# Patient Record
Sex: Female | Born: 1980 | Hispanic: No | Marital: Married | Smoking: Never smoker
Health system: Southern US, Community
[De-identification: ages and names within clinical notes are randomized; demographics above are authoritative.]

## PROBLEM LIST (undated history)

## (undated) DIAGNOSIS — G35 Multiple sclerosis: Secondary | ICD-10-CM

## (undated) HISTORY — PX: NO PAST SURGERIES: SHX2092

---

## 2007-11-24 ENCOUNTER — Inpatient Hospital Stay (HOSPITAL_COMMUNITY): Admission: AD | Admit: 2007-11-24 | Discharge: 2007-11-24 | Payer: Self-pay | Admitting: Obstetrics and Gynecology

## 2008-07-27 ENCOUNTER — Encounter: Admission: RE | Admit: 2008-07-27 | Discharge: 2008-07-27 | Payer: Self-pay | Admitting: Family Medicine

## 2009-07-14 ENCOUNTER — Inpatient Hospital Stay (HOSPITAL_COMMUNITY): Admission: AD | Admit: 2009-07-14 | Discharge: 2009-07-16 | Payer: Self-pay | Admitting: Obstetrics and Gynecology

## 2009-07-15 ENCOUNTER — Encounter (INDEPENDENT_AMBULATORY_CARE_PROVIDER_SITE_OTHER): Payer: Self-pay | Admitting: Obstetrics and Gynecology

## 2010-07-07 NOTE — L&D Delivery Note (Signed)
Delivery Note At 3:02 PM a viable female was delivered via Vaginal, Spontaneous Delivery (Presentation:vtx ).  APGAR: 9, 9; weight .  pending Placenta status: Intact, Spontaneous.  Cord: 3 vessels with the following complications: None.   Anesthesia: Epidural  Episiotomy: none Lacerations: none Suture Repair: none Est. Blood Loss (mL): 400  Mom to postpartum.  Baby to nursery-stable.  Shantele Reller S 02/07/2011, 3:14 PM

## 2010-07-16 LAB — HIV ANTIBODY (ROUTINE TESTING W REFLEX): HIV: NONREACTIVE

## 2010-07-16 LAB — ABO/RH: RH Type: POSITIVE

## 2010-09-22 LAB — RPR: RPR Ser Ql: NONREACTIVE

## 2010-09-22 LAB — CBC
Hemoglobin: 10.3 g/dL — ABNORMAL LOW (ref 12.0–15.0)
MCHC: 33.7 g/dL (ref 30.0–36.0)
MCV: 95.3 fL (ref 78.0–100.0)
Platelets: 174 10*3/uL (ref 150–400)
Platelets: 193 10*3/uL (ref 150–400)
RBC: 3.67 MIL/uL — ABNORMAL LOW (ref 3.87–5.11)
RDW: 14.6 % (ref 11.5–15.5)
WBC: 6.3 10*3/uL (ref 4.0–10.5)
WBC: 9.7 10*3/uL (ref 4.0–10.5)

## 2011-01-02 ENCOUNTER — Inpatient Hospital Stay (HOSPITAL_COMMUNITY)
Admission: AD | Admit: 2011-01-02 | Discharge: 2011-01-02 | Disposition: A | Discharge status: Home / Self Care | Payer: 59 | Source: Ambulatory Visit | Attending: Obstetrics and Gynecology | Admitting: Obstetrics and Gynecology

## 2011-01-02 DIAGNOSIS — O4700 False labor before 37 completed weeks of gestation, unspecified trimester: Secondary | ICD-10-CM

## 2011-02-05 ENCOUNTER — Encounter (HOSPITAL_COMMUNITY): Payer: Self-pay | Admitting: *Deleted

## 2011-02-05 ENCOUNTER — Inpatient Hospital Stay (HOSPITAL_COMMUNITY)
Admission: AD | Admit: 2011-02-05 | Discharge: 2011-02-06 | Disposition: A | Discharge status: Home / Self Care | Payer: 59 | Source: Ambulatory Visit | Attending: Obstetrics and Gynecology | Admitting: Obstetrics and Gynecology

## 2011-02-05 DIAGNOSIS — O479 False labor, unspecified: Secondary | ICD-10-CM | POA: Insufficient documentation

## 2011-02-05 HISTORY — DX: Multiple sclerosis: G35

## 2011-02-05 NOTE — Progress Notes (Cosign Needed)
Pt G4 P2 at 39wks, having contractions and pressure.  SVE 4cm on 7/31.

## 2011-02-06 ENCOUNTER — Encounter (HOSPITAL_COMMUNITY): Payer: Self-pay | Admitting: *Deleted

## 2011-02-06 ENCOUNTER — Other Ambulatory Visit (HOSPITAL_COMMUNITY): Payer: Self-pay | Admitting: Obstetrics and Gynecology

## 2011-02-07 ENCOUNTER — Encounter (HOSPITAL_COMMUNITY): Payer: Self-pay

## 2011-02-07 ENCOUNTER — Inpatient Hospital Stay (HOSPITAL_COMMUNITY): Payer: 59 | Admitting: Anesthesiology

## 2011-02-07 ENCOUNTER — Encounter (HOSPITAL_COMMUNITY): Payer: Self-pay | Admitting: Anesthesiology

## 2011-02-07 ENCOUNTER — Inpatient Hospital Stay (HOSPITAL_COMMUNITY)
Admission: RE | Admit: 2011-02-07 | Discharge: 2011-02-08 | DRG: 775 | Disposition: A | Discharge status: Home / Self Care | Payer: 59 | Source: Ambulatory Visit | Attending: Obstetrics and Gynecology | Admitting: Obstetrics and Gynecology

## 2011-02-07 DIAGNOSIS — Z22330 Carrier of Group B streptococcus: Secondary | ICD-10-CM

## 2011-02-07 DIAGNOSIS — G35 Multiple sclerosis: Secondary | ICD-10-CM | POA: Diagnosis present

## 2011-02-07 DIAGNOSIS — O99892 Other specified diseases and conditions complicating childbirth: Principal | ICD-10-CM | POA: Diagnosis present

## 2011-02-07 LAB — CBC
Hemoglobin: 12.2 g/dL (ref 12.0–15.0)
MCHC: 34.6 g/dL (ref 30.0–36.0)
RDW: 14 % (ref 11.5–15.5)

## 2011-02-07 LAB — RPR: RPR Ser Ql: NONREACTIVE

## 2011-02-07 MED ORDER — PENICILLIN G POTASSIUM 5000000 UNITS IJ SOLR
2.5000 10*6.[IU] | INTRAVENOUS | Status: DC
  Administered 2011-02-07: 2.5 10*6.[IU] via INTRAVENOUS
  Filled 2011-02-07 (×5): qty 2.5

## 2011-02-07 MED ORDER — BISACODYL 10 MG RE SUPP: 10.0000 mg | Freq: Every day | RECTAL | Status: DC | PRN

## 2011-02-07 MED ORDER — PHENYLEPHRINE 40 MCG/ML (10ML) SYRINGE FOR IV PUSH (FOR BLOOD PRESSURE SUPPORT)
80.0000 ug | PREFILLED_SYRINGE | INTRAVENOUS | Status: DC | PRN
  Filled 2011-02-07 (×2): qty 5

## 2011-02-07 MED ORDER — SIMETHICONE 80 MG PO CHEW: 80.0000 mg | CHEWABLE_TABLET | ORAL | Status: DC | PRN

## 2011-02-07 MED ORDER — DIPHENHYDRAMINE HCL 25 MG PO CAPS: 25.0000 mg | ORAL_CAPSULE | Freq: Four times a day (QID) | ORAL | Status: DC | PRN

## 2011-02-07 MED ORDER — LACTATED RINGERS IV SOLN: 500.0000 mL | INTRAVENOUS | Status: DC | PRN

## 2011-02-07 MED ORDER — ACETAMINOPHEN 325 MG PO TABS: 650.0000 mg | ORAL_TABLET | ORAL | Status: DC | PRN

## 2011-02-07 MED ORDER — OXYCODONE-ACETAMINOPHEN 5-325 MG PO TABS: 2.0000 | ORAL_TABLET | ORAL | Status: DC | PRN

## 2011-02-07 MED ORDER — WITCH HAZEL-GLYCERIN EX PADS: 1.0000 "application " | MEDICATED_PAD | CUTANEOUS | Status: DC | PRN

## 2011-02-07 MED ORDER — TETANUS-DIPHTH-ACELL PERTUSSIS 5-2.5-18.5 LF-MCG/0.5 IM SUSP: 0.5000 mL | Freq: Once | INTRAMUSCULAR | Status: DC

## 2011-02-07 MED ORDER — OXYCODONE-ACETAMINOPHEN 5-325 MG PO TABS: 1.0000 | ORAL_TABLET | ORAL | Status: DC | PRN

## 2011-02-07 MED ORDER — OXYTOCIN 20 UNITS IN LACTATED RINGERS INFUSION - SIMPLE
125.0000 mL/h | Freq: Once | INTRAVENOUS | Status: AC
  Administered 2011-02-07: 125 mL/h via INTRAVENOUS

## 2011-02-07 MED ORDER — EPHEDRINE 5 MG/ML INJ
10.0000 mg | INTRAVENOUS | Status: DC | PRN
  Filled 2011-02-07: qty 4

## 2011-02-07 MED ORDER — LIDOCAINE HCL (PF) 1 % IJ SOLN
30.0000 mL | INTRAMUSCULAR | Status: DC | PRN
  Filled 2011-02-07: qty 30

## 2011-02-07 MED ORDER — FLEET ENEMA 7-19 GM/118ML RE ENEM: 1.0000 | ENEMA | RECTAL | Status: DC | PRN

## 2011-02-07 MED ORDER — ONDANSETRON HCL 4 MG PO TABS: 4.0000 mg | ORAL_TABLET | ORAL | Status: DC | PRN

## 2011-02-07 MED ORDER — ONDANSETRON HCL 4 MG/2ML IJ SOLN: 4.0000 mg | Freq: Four times a day (QID) | INTRAMUSCULAR | Status: DC | PRN

## 2011-02-07 MED ORDER — SENNOSIDES-DOCUSATE SODIUM 8.6-50 MG PO TABS
2.0000 | ORAL_TABLET | Freq: Every day | ORAL | Status: DC
  Administered 2011-02-07: 2 via ORAL

## 2011-02-07 MED ORDER — DIBUCAINE 1 % RE OINT: 1.0000 "application " | TOPICAL_OINTMENT | RECTAL | Status: DC | PRN

## 2011-02-07 MED ORDER — EPHEDRINE 5 MG/ML INJ
10.0000 mg | INTRAVENOUS | Status: DC | PRN
  Filled 2011-02-07 (×2): qty 4

## 2011-02-07 MED ORDER — SODIUM CHLORIDE 0.9 % IJ SOLN: 3.0000 mL | INTRAMUSCULAR | Status: DC | PRN

## 2011-02-07 MED ORDER — TERBUTALINE SULFATE 1 MG/ML IJ SOLN: 0.2500 mg | Freq: Once | INTRAMUSCULAR | Status: DC | PRN

## 2011-02-07 MED ORDER — LACTATED RINGERS IV SOLN
INTRAVENOUS | Status: DC
  Administered 2011-02-07 (×2): via INTRAVENOUS

## 2011-02-07 MED ORDER — BENZOCAINE-MENTHOL 20-0.5 % EX AERO: 1.0000 "application " | INHALATION_SPRAY | CUTANEOUS | Status: DC | PRN

## 2011-02-07 MED ORDER — SODIUM CHLORIDE 0.9 % IJ SOLN: 3.0000 mL | Freq: Two times a day (BID) | INTRAMUSCULAR | Status: DC

## 2011-02-07 MED ORDER — DIPHENHYDRAMINE HCL 50 MG/ML IJ SOLN: 12.5000 mg | INTRAMUSCULAR | Status: DC | PRN

## 2011-02-07 MED ORDER — ONDANSETRON HCL 4 MG/2ML IJ SOLN: 4.0000 mg | INTRAMUSCULAR | Status: DC | PRN

## 2011-02-07 MED ORDER — OXYTOCIN 20 UNITS IN LACTATED RINGERS INFUSION - SIMPLE: 125.0000 mL/h | INTRAVENOUS | Status: DC | PRN

## 2011-02-07 MED ORDER — IBUPROFEN 600 MG PO TABS
600.0000 mg | ORAL_TABLET | Freq: Four times a day (QID) | ORAL | Status: DC | PRN
  Filled 2011-02-07: qty 1

## 2011-02-07 MED ORDER — FENTANYL 2.5 MCG/ML BUPIVACAINE 1/10 % EPIDURAL INFUSION (WH - ANES)
14.0000 mL/h | INTRAMUSCULAR | Status: DC
  Administered 2011-02-07 (×2): 14 mL/h via EPIDURAL
  Filled 2011-02-07 (×2): qty 60

## 2011-02-07 MED ORDER — SODIUM CHLORIDE 0.9 % IV SOLN: 250.0000 mL | INTRAVENOUS | Status: DC

## 2011-02-07 MED ORDER — CITRIC ACID-SODIUM CITRATE 334-500 MG/5ML PO SOLN: 30.0000 mL | ORAL | Status: DC | PRN

## 2011-02-07 MED ORDER — LACTATED RINGERS IV SOLN
500.0000 mL | Freq: Once | INTRAVENOUS | Status: AC
  Administered 2011-02-07: 1000 mL via INTRAVENOUS

## 2011-02-07 MED ORDER — PHENYLEPHRINE 40 MCG/ML (10ML) SYRINGE FOR IV PUSH (FOR BLOOD PRESSURE SUPPORT)
80.0000 ug | PREFILLED_SYRINGE | INTRAVENOUS | Status: DC | PRN
  Filled 2011-02-07: qty 5

## 2011-02-07 MED ORDER — LIDOCAINE HCL 1.5 % IJ SOLN
INTRAMUSCULAR | Status: DC | PRN
  Administered 2011-02-07 (×2): 4 mL

## 2011-02-07 MED ORDER — IBUPROFEN 600 MG PO TABS
600.0000 mg | ORAL_TABLET | Freq: Four times a day (QID) | ORAL | Status: DC
  Administered 2011-02-07 – 2011-02-08 (×4): 600 mg via ORAL
  Filled 2011-02-07 (×3): qty 1

## 2011-02-07 MED ORDER — PRENATAL PLUS 27-1 MG PO TABS
1.0000 | ORAL_TABLET | Freq: Every day | ORAL | Status: DC
  Administered 2011-02-08: 1 via ORAL
  Filled 2011-02-07: qty 1

## 2011-02-07 MED ORDER — PENICILLIN G POTASSIUM 5000000 UNITS IJ SOLR
5.0000 10*6.[IU] | Freq: Once | INTRAVENOUS | Status: AC
  Administered 2011-02-07: 5 10*6.[IU] via INTRAVENOUS
  Filled 2011-02-07: qty 5

## 2011-02-07 MED ORDER — LANOLIN HYDROUS EX OINT: TOPICAL_OINTMENT | CUTANEOUS | Status: DC | PRN

## 2011-02-07 MED ORDER — ZOLPIDEM TARTRATE 5 MG PO TABS: 5.0000 mg | ORAL_TABLET | Freq: Every evening | ORAL | Status: DC | PRN

## 2011-02-07 MED ORDER — OXYTOCIN 20 UNITS IN LACTATED RINGERS INFUSION - SIMPLE
1.0000 m[IU]/min | INTRAVENOUS | Status: DC
  Administered 2011-02-07: 2 m[IU]/min via INTRAVENOUS
  Filled 2011-02-07: qty 1000

## 2011-02-07 NOTE — Progress Notes (Signed)
Alexa Goodwin is a 30 y.o. [id number] at [redacted]w[redacted]d by ultrasound admitted for induction of labor due to Elective at term.  Subjective:   Objective: BP 113/69  Pulse 64  Temp(Src) 98.1 F (36.7 C) (Oral)  Resp 18  Ht 5\' 6"  (1.676 m)  Wt 159 lb (72.122 kg)  BMI 25.66 kg/m2  SpO2 99%      FHT:  FHR: 15 bpm, variability: moderate,  accelerations:  Present,  decelerations:  Absent UC:   regular, every 3 minutes SVE:   Dilation: 6.5 Effacement (%): 100 Station: -1 Exam by:: Aaron Boeh  Labs: Lab Results  Component Value Date   WBC 5.5 02/07/2011   HGB 12.2 02/07/2011   HCT 35.3* 02/07/2011   MCV 94.1 02/07/2011   PLT 175 02/07/2011    Assessment / Plan: Induction of labor due to term favorable cx,  progressing well on pitocin  Labor: Progressing normally Preeclampsia:  no signs or symptoms of toxicity Fetal Wellbeing:  Category I Pain Control:  Epidural I/D:  n/a Anticipated MOD:  NSVD  Shady Bradish S 02/07/2011, 1:43 PM

## 2011-02-07 NOTE — Anesthesia Procedure Notes (Signed)
Epidural Patient location during procedure: OB Start time: 02/07/2011 9:35 AM  Staffing Anesthesiologist: Tylan Kinn A. Performed by: anesthesiologist   Preanesthetic Checklist Completed: patient identified, site marked, surgical consent, pre-op evaluation, timeout performed, IV checked, risks and benefits discussed and monitors and equipment checked  Epidural Patient position: sitting Prep: site prepped and draped and DuraPrep Patient monitoring: continuous pulse ox and blood pressure Approach: midline Injection technique: LOR air  Needle:  Needle type: Tuohy  Needle gauge: 17 G Needle length: 9 cm Needle insertion depth: 5 cm cm Catheter type: closed end flexible Catheter size: 19 Gauge Catheter at skin depth: 10 cm Test dose: negative and 1.5% lidocaine  Assessment Sensory level: T8 Events: blood not aspirated, injection not painful, no injection resistance, negative IV test and no paresthesia  Additional Notes Patient is more comfortable after epidural dosed. Please see RN's note for documentation of vital signs and FHR which are stable.

## 2011-02-07 NOTE — H&P (Signed)
Alayza Donze is a 30 y.o. female presenting for induction.  39.3 weeks with favorable cervix.  PNC COMPLICATED BY MULTIPLE SCLEROSIS.  POST GBS. History OB History    Grav Para Term Preterm Abortions TAB SAB Ect Mult Living   4 2 2  0 1 0 1 0 0 2     Past Medical History  Diagnosis Date  . Multiple sclerosis    Past Surgical History  Procedure Date  . No past surgeries    Family History: family history is not on file. Social History:  reports that she has never smoked. She has never used smokeless tobacco. She reports that she does not drink alcohol or use illicit drugs.  ROS    Height 5\' 6"  (1.676 m), weight 159 lb (72.122 kg). Exam Physical Exam GRAVID UTERUS C/W DATES.  CERVIX 4 CM AND 50%.  VTX. Prenatal labs: ABO, Rh:   Antibody: Negative (01/10 0000) Rubella:   RPR: Nonreactive (01/10 0000)  HBsAg: Negative (01/10 0000)  HIV: Non-reactive (01/10 0000)  GBS: Positive (07/10 0000)   Assessment/Plan: PLAN AROM.  RISK OF PITOCIN DISCUSSED.  PENICILLIN FOR GBS.   Syniyah Bourne S 02/07/2011, 8:23 AM

## 2011-02-07 NOTE — Anesthesia Preprocedure Evaluation (Signed)
Anesthesia Evaluation  Name, MR# and DOB Patient awake  General Assessment Comment  Reviewed: Allergy & Precautions, H&P  and Patient's Chart, lab work & pertinent test results  Airway Mallampati: II TM Distance: >3 FB Neck ROM: full    Dental No notable dental hx (+) Teeth Intact   Pulmonaryneg pulmonary ROS    clear to auscultation  pulmonary exam normal   Cardiovascular regular Normal   Neuro/PsychHx/o Multiple Sclerosis. Quiescent for last year. Sx numbness lower extremities. On no Rx. Asymptomatic at present time   GI/Hepatic/Renal negative GI ROS, negative Liver ROS, and negative Renal ROS (+)       Endo/Other  Negative Endocrine ROS (+)   Abdominal   Musculoskeletal  Hematology negative hematology ROS (+)   Peds  Reproductive/Obstetrics (+) Pregnancy   Anesthesia Other Findings             Anesthesia Physical Anesthesia Plan  ASA: II  Anesthesia Plan: Epidural   Post-op Pain Management:    Induction:   Airway Management Planned:   Additional Equipment:   Intra-op Plan:   Post-operative Plan:   Informed Consent: I have reviewed the patients History and Physical, chart, labs and discussed the procedure including the risks, benefits and alternatives for the proposed anesthesia with the patient or authorized representative who has indicated his/her understanding and acceptance.     Plan Discussed with: Anesthesiologist  Anesthesia Plan Comments:         Anesthesia Quick Evaluation

## 2011-02-08 ENCOUNTER — Encounter (HOSPITAL_COMMUNITY): Payer: Self-pay

## 2011-02-08 LAB — CBC
Platelets: 147 10*3/uL — ABNORMAL LOW (ref 150–400)
RDW: 14 % (ref 11.5–15.5)
WBC: 7.8 10*3/uL (ref 4.0–10.5)

## 2011-02-08 NOTE — Progress Notes (Signed)
Post [place] Day one Subjective: no complaints  Objective: Blood pressure 105/73, pulse 73, temperature 97.9 F (36.6 C), temperature source Other (Comment), resp. rate 18, height 5\' 6"  (1.676 m), weight 159 lb (72.122 kg), SpO2 99.00%, unknown if currently breastfeeding.  Physical Exam:  General: alert Lochia: appropriate Uterine Fundus: firm DVT Evaluation: No evidence of DVT seen on physical exam.   Basename 02/08/11 0523 02/07/11 0830  HGB 10.4* 12.2  HCT 30.8* 35.3*    Assessment/Plan: Discharge home   LOS: 1 day   Janes Colegrove S 02/08/2011, 8:55 AM

## 2011-02-08 NOTE — Anesthesia Postprocedure Evaluation (Signed)
  Anesthesia Post-op Note  Patient: Archivist  Procedure(s) Performed: * Lumbar Epidural for L & D *  Patient Location: Labor and Delivery  Anesthesia Type: Epidural  Level of Consciousness: awake, alert  and oriented  Airway and Oxygen Therapy: Patient Spontanous Breathing  Post-op Pain: none  Post-op Assessment: Post-op Vital signs reviewed, Patient's Cardiovascular Status Stable, Respiratory Function Stable, Patent Airway, No signs of Nausea or vomiting, Pain level controlled, No headache, No backache, No residual numbness and No residual motor weakness  Post-op Vital Signs: Reviewed and stable  Complications: No apparent anesthesia complications

## 2011-02-08 NOTE — Progress Notes (Signed)
Baby going to nursery for circ. Mom reports that baby has been nursing very often throughout the night.  Reassurance given. Mom has history of low milk supply and only nrused 1 month with last baby. (Nursed the first baby for 5 months but needed supplementation) With both babies, began supplementation after a few Merrisa Skorupski because she "didn't have enough milk" Encouraged to page for assist when baby wakes after circ. No questions at present.

## 2011-02-08 NOTE — Progress Notes (Signed)
Baby in nursery for hearing screen now. Mom states baby nursed well for 10 minutes just before going to nursery. Manual pump given with instructions. No questions at present. For discharge later today. Encouraged to call for outpatient appt if needed.

## 2011-02-08 NOTE — Discharge Summary (Signed)
Obstetric Discharge Summary Reason for Admission: onset of labor and induction of labor Prenatal Procedures: none Intrapartum Procedures: spontaneous vaginal delivery Postpartum Procedures: none Complications-Operative and Postpartum: none Hemoglobin  Date Value Range Status  02/08/2011 10.4* 12.0-15.0 (g/dL) Final     HCT  Date Value Range Status  02/08/2011 30.8* 36.0-46.0 (%) Final    Discharge Diagnoses: Term Pregnancy-delivered  Discharge Information: Date: 02/08/2011 Activity: pelvic rest Diet: routine Medications: PNV Condition: stable Instructions: refer to practice specific booklet Discharge to: home Follow-up Information    Call in 6 weeks to follow up.         Newborn Data: Live born female  Birth Weight: 6 lb 5.9 oz (2890 g) APGAR: 9, 9  Home with mother.  Meggin Ola S 02/08/2011, 9:04 AM

## 2011-02-08 NOTE — Anesthesia Postprocedure Evaluation (Signed)
  Anesthesia Post-op Note  Patient: Alexa Goodwin  Procedure(s) Performed: * No surgery found *  Patient Location: PACU and Mother/Baby  Anesthesia Type: Epidural  Level of Consciousness: awake, alert , oriented and patient cooperative  Airway and Oxygen Therapy: Patient Spontanous Breathing  Post-op Pain: none  Post-op Assessment: Post-op Vital signs reviewed, Respiratory Function Stable and No signs of Nausea or vomiting  Post-op Vital Signs: Reviewed and stable  Complications: No apparent anesthesia complications

## 2011-04-02 LAB — URINALYSIS, ROUTINE W REFLEX MICROSCOPIC
Nitrite: NEGATIVE
Protein, ur: NEGATIVE
Specific Gravity, Urine: 1.02
Urobilinogen, UA: 0.2

## 2011-04-02 LAB — URINE MICROSCOPIC-ADD ON

## 2011-11-06 ENCOUNTER — Telehealth: Payer: Self-pay | Admitting: *Deleted

## 2011-11-12 ENCOUNTER — Telehealth: Payer: Self-pay | Admitting: Internal Medicine

## 2011-11-12 NOTE — Telephone Encounter (Signed)
Referred by Dr. Zelphia Cairo Dx- Neutropenia

## 2011-11-12 NOTE — Telephone Encounter (Signed)
pt aware of her new pt appt 5/14   aom

## 2011-11-18 ENCOUNTER — Ambulatory Visit (HOSPITAL_BASED_OUTPATIENT_CLINIC_OR_DEPARTMENT_OTHER): Payer: 59 | Admitting: Internal Medicine

## 2011-11-18 ENCOUNTER — Ambulatory Visit: Payer: 59

## 2011-11-18 ENCOUNTER — Other Ambulatory Visit (HOSPITAL_BASED_OUTPATIENT_CLINIC_OR_DEPARTMENT_OTHER): Payer: 59 | Admitting: Lab

## 2011-11-18 DIAGNOSIS — D649 Anemia, unspecified: Secondary | ICD-10-CM

## 2011-11-18 DIAGNOSIS — D61818 Other pancytopenia: Secondary | ICD-10-CM

## 2011-11-18 DIAGNOSIS — D539 Nutritional anemia, unspecified: Secondary | ICD-10-CM

## 2011-11-18 DIAGNOSIS — D709 Neutropenia, unspecified: Secondary | ICD-10-CM

## 2011-11-18 LAB — CBC & DIFF AND RETIC
BASO%: 0.5 % (ref 0.0–2.0)
Basophils Absolute: 0 10*3/uL (ref 0.0–0.1)
EOS%: 6.7 % (ref 0.0–7.0)
Eosinophils Absolute: 0.3 10*3/uL (ref 0.0–0.5)
HCT: 38.6 % (ref 34.8–46.6)
HGB: 13.3 g/dL (ref 11.6–15.9)
Immature Retic Fract: 3.1 % (ref 1.60–10.00)
LYMPH%: 45.2 % (ref 14.0–49.7)
MCH: 30.6 pg (ref 25.1–34.0)
MCHC: 34.5 g/dL (ref 31.5–36.0)
MCV: 88.7 fL (ref 79.5–101.0)
MONO#: 0.4 10*3/uL (ref 0.1–0.9)
MONO%: 10.4 % (ref 0.0–14.0)
NEUT#: 1.5 10*3/uL (ref 1.5–6.5)
NEUT%: 37.2 % — ABNORMAL LOW (ref 38.4–76.8)
Platelets: 291 10*3/uL (ref 145–400)
RBC: 4.35 10*6/uL (ref 3.70–5.45)
RDW: 13.3 % (ref 11.2–14.5)
Retic %: 1.23 % (ref 0.70–2.10)
Retic Ct Abs: 53.51 10*3/uL (ref 33.70–90.70)
WBC: 4.1 10*3/uL (ref 3.9–10.3)
lymph#: 1.8 10*3/uL (ref 0.9–3.3)

## 2011-11-18 LAB — COMPREHENSIVE METABOLIC PANEL
Albumin: 4.4 g/dL (ref 3.5–5.2)
Alkaline Phosphatase: 58 U/L (ref 39–117)
BUN: 14 mg/dL (ref 6–23)
CO2: 25 mEq/L (ref 19–32)
Glucose, Bld: 86 mg/dL (ref 70–99)
Potassium: 4.2 mEq/L (ref 3.5–5.3)
Total Bilirubin: 0.4 mg/dL (ref 0.3–1.2)

## 2011-11-18 LAB — FERRITIN: Ferritin: 22 ng/mL (ref 10–291)

## 2011-11-18 LAB — IRON AND TIBC
Iron: 58 ug/dL (ref 42–145)
UIBC: 262 ug/dL (ref 125–400)

## 2011-11-18 LAB — LACTATE DEHYDROGENASE: LDH: 127 U/L (ref 94–250)

## 2011-11-18 LAB — FOLATE: Folate: >20 ng/mL

## 2011-11-18 NOTE — Progress Notes (Signed)
[facility] [phone]   [phone]  CONSULT NOTE  REASON FOR CONSULTATION:  31 years old white female with you pancytopenia  HPI Alexa Goodwin is a 31 y.o. female with no significant past medical history except for multiple sclerosis and history of anemia during pregnancy. The patient mentions that for the last 3 weeks she has been complaining of some dizzy spells. She was seen by her gynecologist Dr.Adkins and CBC was performed on 11/05/2011 and it showed low white blood count of 3.2 with absolute neutrophil count of 1400. The patient had normal hemoglobin of 13.1 hematocrit 39.4% and normal platelets count of 250,000. Previous CBCs over the last few years showed variation in her white blood count between normal to slightly low. On January 8 of 2007 her white blood count was 6.3, on 07/17/2010 WBC was 3.3 on August 3 of 2012 her white blood count was 5.5. The patient is feeling fine today with no specific complaints. She had some vaginal blood loss after IUD placement. She denied having any significant weight loss or night sweats. She has no bleeding or bruises, no palpable lymphadenopathy. She does not take any over-the-counter medication except for occasional Advil every few weeks. She denied having any recent viral infection. The patient is not on any chronic medication except for multivitamins.  She is married and has 3 children age 38 years, 2 years and 9 months. 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   @SFHPI @  Past Medical History  Diagnosis Date  . Multiple sclerosis     Past Surgical History  Procedure Date  . No past surgeries     No family history on file.  Social History History  Substance Use Topics  . Smoking status: Never Smoker   . Smokeless tobacco: Never Used  . Alcohol Use: No    No Known Allergies  Current Outpatient Prescriptions  Medication Sig Dispense Refill  . calcium carbonate  (OS-CAL) 600 MG TABS Take 600 mg by mouth daily.      . fish oil-omega-3 fatty acids 1000 MG capsule Take 2 g by mouth daily.      . Multiple Vitamin (MULTIVITAMIN) tablet Take 1 tablet by mouth daily.      . vitamin B-12 (CYANOCOBALAMIN) 50 MCG tablet Take 50 mcg by mouth daily.      . prenatal vitamin w/FE, FA (PRENATAL 1 + 1) 27-1 MG TABS Take 1 tablet by mouth daily.          Review of Systems  A comprehensive review of systems was negative.  Physical Exam  AVW:UJWJX, healthy, no distress, well nourished and well developed SKIN: skin color, texture, turgor are normal HEAD: Normocephalic, No masses, lesions, tenderness or abnormalities EYES: normal EARS: External ears normal OROPHARYNX:no exudate and no erythema  NECK: supple, no adenopathy LYMPH:  no palpable lymphadenopathy, no hepatosplenomegaly BREAST:not examined LUNGS: clear to auscultation  HEART: regular rate & rhythm, no murmurs and no gallops ABDOMEN:abdomen soft, non-tender, normal bowel sounds and no masses or organomegaly BACK: Back symmetric, no curvature. EXTREMITIES:no joint deformities, effusion, or inflammation, no edema, no skin discoloration, no clubbing, no cyanosis  NEURO: alert & oriented x 3 with fluent speech, no focal motor/sensory deficits, gait normal  PERFORMANCE STATUS: ECOG 0  Studies/Results: CBC today showed white blood count was normal at 4.1, absolute neutrophil count 1500, hemoglobin 13.3, hematocrit 38.6% and platelets count 291,000. Comprehensive metabolic panel was unremarkable for any abnormality.  LDH was normal at 127. Iron 58, TIBC 320, iron saturation 18%.   ASSESSMENT: This is a very pleasant 31 years old white female who was seen for evaluation of Leukocytopenia. The patient is feeling better today and her daughter white blood count is normal, with no significant abnormalities in her CBC. Her previous leukocytopenia could be transient in nature that resolved spontaneously.  PLAN: I  have a lengthy discussion with the patient and her husband today about her condition.  I did not recommend any further investigation at this point.  I will continue the patient observation with routine followup visit with her primary care physician or gynecologist.  I don't see a need for the patient will continue routine followup visit with me at the cancer Center at this point but I would be happy to see her in the future if needed. I gave the patient and her husband the time to ask questions and I answered them completely to their satisfactions.  All questions were answered. The patient knows to call the clinic with any problems, questions or concerns. We can certainly see the patient much sooner if necessary.  Thank you so much for allowing me to participate in the care of Alexa Goodwin. I will continue to follow up the patient with you and assist in her care.  I spent 25 minutes counseling the patient face to face. The total time spent in the appointment was 50 minutes.   Alexa Goodwin K. 11/18/2011, 3:10 PM

## 2012-02-11 NOTE — Telephone Encounter (Signed)
No note

## 2014-05-08 ENCOUNTER — Encounter (HOSPITAL_COMMUNITY): Payer: Self-pay

## 2014-05-25 ENCOUNTER — Other Ambulatory Visit: Payer: Self-pay | Admitting: Obstetrics and Gynecology

## 2014-05-29 LAB — CYTOLOGY - PAP

## 2014-06-08 ENCOUNTER — Other Ambulatory Visit: Payer: Self-pay | Admitting: Obstetrics and Gynecology

## 2014-09-19 ENCOUNTER — Other Ambulatory Visit: Payer: Self-pay | Admitting: Obstetrics and Gynecology

## 2014-09-20 LAB — CYTOLOGY - PAP

## 2019-05-17 ENCOUNTER — Other Ambulatory Visit: Payer: Self-pay

## 2019-05-17 DIAGNOSIS — Z20822 Contact with and (suspected) exposure to covid-19: Secondary | ICD-10-CM

## 2019-05-18 ENCOUNTER — Telehealth: Payer: Self-pay | Admitting: General Practice

## 2019-05-18 LAB — NOVEL CORONAVIRUS, NAA: SARS-CoV-2, NAA: NOT DETECTED

## 2019-05-18 NOTE — Telephone Encounter (Signed)
Negative COVID results given. Patient results "NOT Detected." Caller expressed understanding. ° °

## 2019-12-18 ENCOUNTER — Encounter (HOSPITAL_BASED_OUTPATIENT_CLINIC_OR_DEPARTMENT_OTHER): Payer: Self-pay

## 2019-12-18 ENCOUNTER — Emergency Department (HOSPITAL_BASED_OUTPATIENT_CLINIC_OR_DEPARTMENT_OTHER): Payer: BLUE CROSS/BLUE SHIELD

## 2019-12-18 ENCOUNTER — Other Ambulatory Visit: Payer: Self-pay

## 2019-12-18 ENCOUNTER — Emergency Department (HOSPITAL_BASED_OUTPATIENT_CLINIC_OR_DEPARTMENT_OTHER)
Admission: EM | Admit: 2019-12-18 | Discharge: 2019-12-18 | Disposition: A | Discharge status: Home / Self Care | Payer: BLUE CROSS/BLUE SHIELD | Attending: Emergency Medicine | Admitting: Emergency Medicine

## 2019-12-18 DIAGNOSIS — Z3202 Encounter for pregnancy test, result negative: Secondary | ICD-10-CM | POA: Diagnosis not present

## 2019-12-18 DIAGNOSIS — R002 Palpitations: Secondary | ICD-10-CM | POA: Diagnosis not present

## 2019-12-18 DIAGNOSIS — R9431 Abnormal electrocardiogram [ECG] [EKG]: Secondary | ICD-10-CM

## 2019-12-18 DIAGNOSIS — R0602 Shortness of breath: Secondary | ICD-10-CM | POA: Diagnosis present

## 2019-12-18 LAB — CBC
HCT: 40.5 % (ref 36.0–46.0)
Hemoglobin: 13.7 g/dL (ref 12.0–15.0)
MCH: 32.4 pg (ref 26.0–34.0)
MCHC: 33.8 g/dL (ref 30.0–36.0)
MCV: 95.7 fL (ref 80.0–100.0)
Platelets: 244 10*3/uL (ref 150–400)
RBC: 4.23 MIL/uL (ref 3.87–5.11)
RDW: 13.2 % (ref 11.5–15.5)
WBC: 2.8 10*3/uL — ABNORMAL LOW (ref 4.0–10.5)
nRBC: 0 % (ref 0.0–0.2)

## 2019-12-18 LAB — BASIC METABOLIC PANEL
Anion gap: 9 (ref 5–15)
BUN: 10 mg/dL (ref 6–20)
CO2: 24 mmol/L (ref 22–32)
Calcium: 8.6 mg/dL — ABNORMAL LOW (ref 8.9–10.3)
Chloride: 103 mmol/L (ref 98–111)
Creatinine, Ser: 0.72 mg/dL (ref 0.44–1.00)
GFR calc Af Amer: >60 mL/min (ref >60)
GFR calc non Af Amer: >60 mL/min (ref >60)
Glucose, Bld: 101 mg/dL — ABNORMAL HIGH (ref 70–99)
Potassium: 3.8 mmol/L (ref 3.5–5.1)
Sodium: 136 mmol/L (ref 135–145)

## 2019-12-18 LAB — TROPONIN I (HIGH SENSITIVITY): Troponin I (High Sensitivity): <2 ng/L (ref <18)

## 2019-12-18 LAB — PREGNANCY, URINE: Preg Test, Ur: NEGATIVE

## 2019-12-18 NOTE — ED Provider Notes (Signed)
[facility] EMERGENCY DEPARTMENT Provider Note   CSN: [id number] Arrival date & time: 12/18/19  1327     History Chief Complaint  Patient presents with  . Palpitations  . Shortness of Breath    Alexa Goodwin is a 39 y.o. female.  Patient with history of MS presents to the emergency department today with complaints of palpitations ongoing over the past 3 days with associated shortness of breath, nausea, lightheadedness, and sensation of movement.  Symptoms started gradually.  She describes that when she stands up, she gets dizzy stating it feels like the room is moving.  In addition, she has a sensation of "fluttering" that radiates up into her neck.  She denies any associated vomiting, diaphoresis, chest pain.  She denies any headache, vision loss although she has had some blurry vision.  No ear pain, runny nose, sore throat.  No fever.  No abdominal pain or diarrhea.  No urinary symptoms. Patient denies risk factors for pulmonary embolism including: unilateral leg swelling, history of DVT/PE/other blood clots, use of exogenous hormones, recent immobilizations, recent surgery, recent travel (>4hr segment), malignancy, hemoptysis.  She is not currently on any treatment for MS and states that her previous symptoms have been since sensory in nature, burning, and not like her current symptoms.  She states that she had similar symptoms that were milder in November 2020.  Those spontaneously resolved.  She denies history of thyroid problems and states that she had her thyroid tested in the past year.  She drinks approximately 2 cups of caffeinated beverage a day.        Past Medical History:  Diagnosis Date  . Multiple sclerosis ([place])     There are no problems to display for this patient.   Past Surgical History:  Procedure Laterality Date  . NO PAST SURGERIES       OB History    Gravida  4   Para  3   Term  3   Preterm  0   AB  1   Living  3     SAB  1   TAB    0   Ectopic  0   Multiple  0   Live Births  3           No family history on file.  Social History   Tobacco Use  . Smoking status: Never Smoker  . Smokeless tobacco: Never Used  Substance Use Topics  . Alcohol use: No    Comment: social  . Drug use: No    Home Medications Prior to Admission medications   Medication Sig Start Date End Date Taking? Authorizing Provider  calcium carbonate (OS-CAL) 600 MG TABS Take 600 mg by mouth daily.    [provider]  fish oil-omega-3 fatty acids 1000 MG capsule Take 2 g by mouth daily.    [provider]  Multiple Vitamin (MULTIVITAMIN) tablet Take 1 tablet by mouth daily.    [provider]  prenatal vitamin w/FE, FA (PRENATAL 1 + 1) 27-1 MG TABS Take 1 tablet by mouth daily.      [provider]  vitamin B-12 (CYANOCOBALAMIN) 50 MCG tablet Take 50 mcg by mouth daily.    [provider]    Allergies    Patient has no known allergies.  Review of Systems   Review of Systems  Constitutional: Negative for fever.  HENT: Negative for rhinorrhea and sore throat.   Eyes: Positive for visual disturbance (blurry). Negative  for redness.  Respiratory: Positive for shortness of breath. Negative for cough.   Cardiovascular: Positive for palpitations. Negative for chest pain.  Gastrointestinal: Positive for nausea. Negative for abdominal pain, diarrhea and vomiting.  Endocrine: Negative for cold intolerance and heat intolerance.  Genitourinary: Negative for dysuria.  Musculoskeletal: Negative for myalgias.  Skin: Negative for rash.  Neurological: Positive for light-headedness. Negative for headaches.  Psychiatric/Behavioral: The patient is nervous/anxious.     Physical Exam Updated Vital Signs BP 135/83 (BP Location: Right Arm)   Pulse 81   Temp 98.6 F (37 C) (Oral)   Resp 18   Ht 5\' 5"  (1.651 m)   Wt 62.6 kg   SpO2 100%   BMI 22.96 kg/m   Physical Exam Vitals and nursing note  reviewed.  Constitutional:      Appearance: She is well-developed.  HENT:     Head: Normocephalic and atraumatic.  Eyes:     General:        Right eye: No discharge.        Left eye: No discharge.     Conjunctiva/sclera: Conjunctivae normal.  Neck:     Thyroid: No thyromegaly.  Cardiovascular:     Rate and Rhythm: Normal rate and regular rhythm.     Heart sounds: Normal heart sounds.  Pulmonary:     Effort: Pulmonary effort is normal.     Breath sounds: Normal breath sounds. No decreased breath sounds, wheezing, rhonchi or rales.  Abdominal:     Palpations: Abdomen is soft.     Tenderness: There is no abdominal tenderness.  Musculoskeletal:     Cervical back: Normal range of motion and neck supple.     Right lower leg: No tenderness. No edema.     Left lower leg: No tenderness. No edema.  Skin:    General: Skin is warm and dry.  Neurological:     Mental Status: She is alert.  Psychiatric:        Mood and Affect: Mood is anxious.     ED Results / Procedures / Treatments   Labs (all labs ordered are listed, but only abnormal results are displayed) Labs Reviewed  BASIC METABOLIC PANEL - Abnormal; Notable for the following components:      Result Value   Glucose, Bld 101 (*)    Calcium 8.6 (*)    All other components within normal limits  CBC - Abnormal; Notable for the following components:   WBC 2.8 (*)    All other components within normal limits  PREGNANCY, URINE  TROPONIN I (HIGH SENSITIVITY)  TROPONIN I (HIGH SENSITIVITY)    EKG EKG Interpretation  Date/Time:  Sunday December 18 2019 13:44:25 EDT Ventricular Rate:  81 PR Interval:  98 QRS Duration: 78 QT Interval:  376 QTC Calculation: 436 R Axis:   40 Text Interpretation: Sinus rhythm with short PR Otherwise normal ECG No old tracing to compare Confirmed by [id number] [phone]) on 12/18/2019 1:52:57 PM   Radiology DG Chest Portable 1 View  Result Date: 12/18/2019 CLINICAL DATA:  Heart palpitations since  Thursday EXAM: PORTABLE CHEST 1 VIEW COMPARISON:  None. FINDINGS: Normal heart size and mediastinal contours. Artifact from EKG leads. No acute infiltrate or edema. No effusion or pneumothorax. No acute osseous findings. IMPRESSION: Negative chest. Electronically Signed   By: Wednesday M.D.   On: 12/18/2019 14:32    Procedures Procedures (including critical care time)  Medications Ordered in ED Medications - No data to display  ED Course  I have reviewed the triage vital signs and the nursing notes.  Pertinent labs & imaging results that were available during my care of the patient were reviewed by me and considered in my medical decision making (see chart for details).  Patient seen and examined. Work-up initiated. Reviewed EKG with Dr. Lynelle Doctor. ? Delta wave.  Vital signs reviewed and are as follows: BP 135/83 (BP Location: Right Arm)   Pulse 81   Temp 98.6 F (37 C) (Oral)   Resp 18   Ht 5\' 5"  (1.651 m)   Wt 62.6 kg   SpO2 100%   BMI 22.96 kg/m   3:48 PM work-up in the ED is reassuring.  I discussed with the patient at bedside the findings of a short PR interval on her EKG.  We discussed that sometimes this could be a sign of Wolff-Parkinson-White syndrome.  I feel that she is stable for discharged home at this time, however think it would be reasonable for her to follow-up for cardiologist opinion.  I have provided referral.  Discussed avoidance of caffeine, cough and cold medications, alcohol.     MDM Rules/Calculators/A&P                          Patient with palpitations.  Work-up here is reassuring.  EKG with shortened PR interval, question of delta wave.  Will have patient minimize caffeine intake and follow-up with cardiology for their opinion.   Final Clinical Impression(s) / ED Diagnoses Final diagnoses:  Palpitations  Shortened PR interval    Rx / DC Orders ED Discharge Orders    None       , PA-C 12/18/19 1551    12/20/19,  MD 12/19/19 [phone]

## 2019-12-18 NOTE — Discharge Instructions (Signed)
Please read and follow all provided instructions.  Your diagnoses today include:  1. Palpitations   2. Shortened PR interval     Tests performed today include:  An EKG of your heart - shows a shorted PR interval which sometimes can be a sign of Wolff-Parkinson-White syndrome. Please follow-up with the cardiologist listed to get their opinion about this.   A chest x-ray  Cardiac enzymes - a blood test for heart muscle damage  Blood counts and electrolytes  Vital signs. See below for your results today.   Medications prescribed:   None  Take any prescribed medications only as directed.  Follow-up instructions: Please follow-up with your primary care provider as soon as you can for further evaluation of your symptoms.   Return instructions:  SEEK IMMEDIATE MEDICAL ATTENTION IF:  You have severe chest pain, especially if the pain is crushing or pressure-like and spreads to the arms, back, neck, or jaw, or if you have sweating, nausea (feeling sick to your stomach), or shortness of breath. THIS IS AN EMERGENCY. Don't wait to see if the pain will go away. Get medical help at once. Call 911 or 0 (operator). DO NOT drive yourself to the hospital.   Your chest pain gets worse and does not go away with rest.   You have an attack of chest pain lasting longer than usual, despite rest and treatment with the medications your caregiver has prescribed.   You wake from sleep with chest pain or shortness of breath.  You feel dizzy or faint.  You have chest pain not typical of your usual pain for which you originally saw your caregiver.   You have any other emergent concerns regarding your health.  Additional Information: Please avoid caffeine, alcohol, decongestant cold medications as this can make palpitations worse.    Your vital signs today were: BP 135/83 (BP Location: Right Arm)   Pulse 81   Temp 98.6 F (37 C) (Oral)   Resp 18   Ht 5\' 5"  (1.651 m)   Wt 62.6 kg   SpO2 100%    BMI 22.96 kg/m  If your blood pressure (BP) was elevated above 135/85 this visit, please have this repeated by your doctor within one month. --------------

## 2019-12-18 NOTE — ED Triage Notes (Signed)
Pt arrives with c/o SOB and heart palpitations since Thursday. Pt reports history of MS.

## 2020-11-10 IMAGING — DX DG CHEST 1V PORT
1 series · 1 of 1 positions shown · non-contrast
Comparison: None.

CLINICAL DATA: Heart palpitations since [REDACTED]

EXAM:
PORTABLE CHEST 1 VIEW

[chest ap]
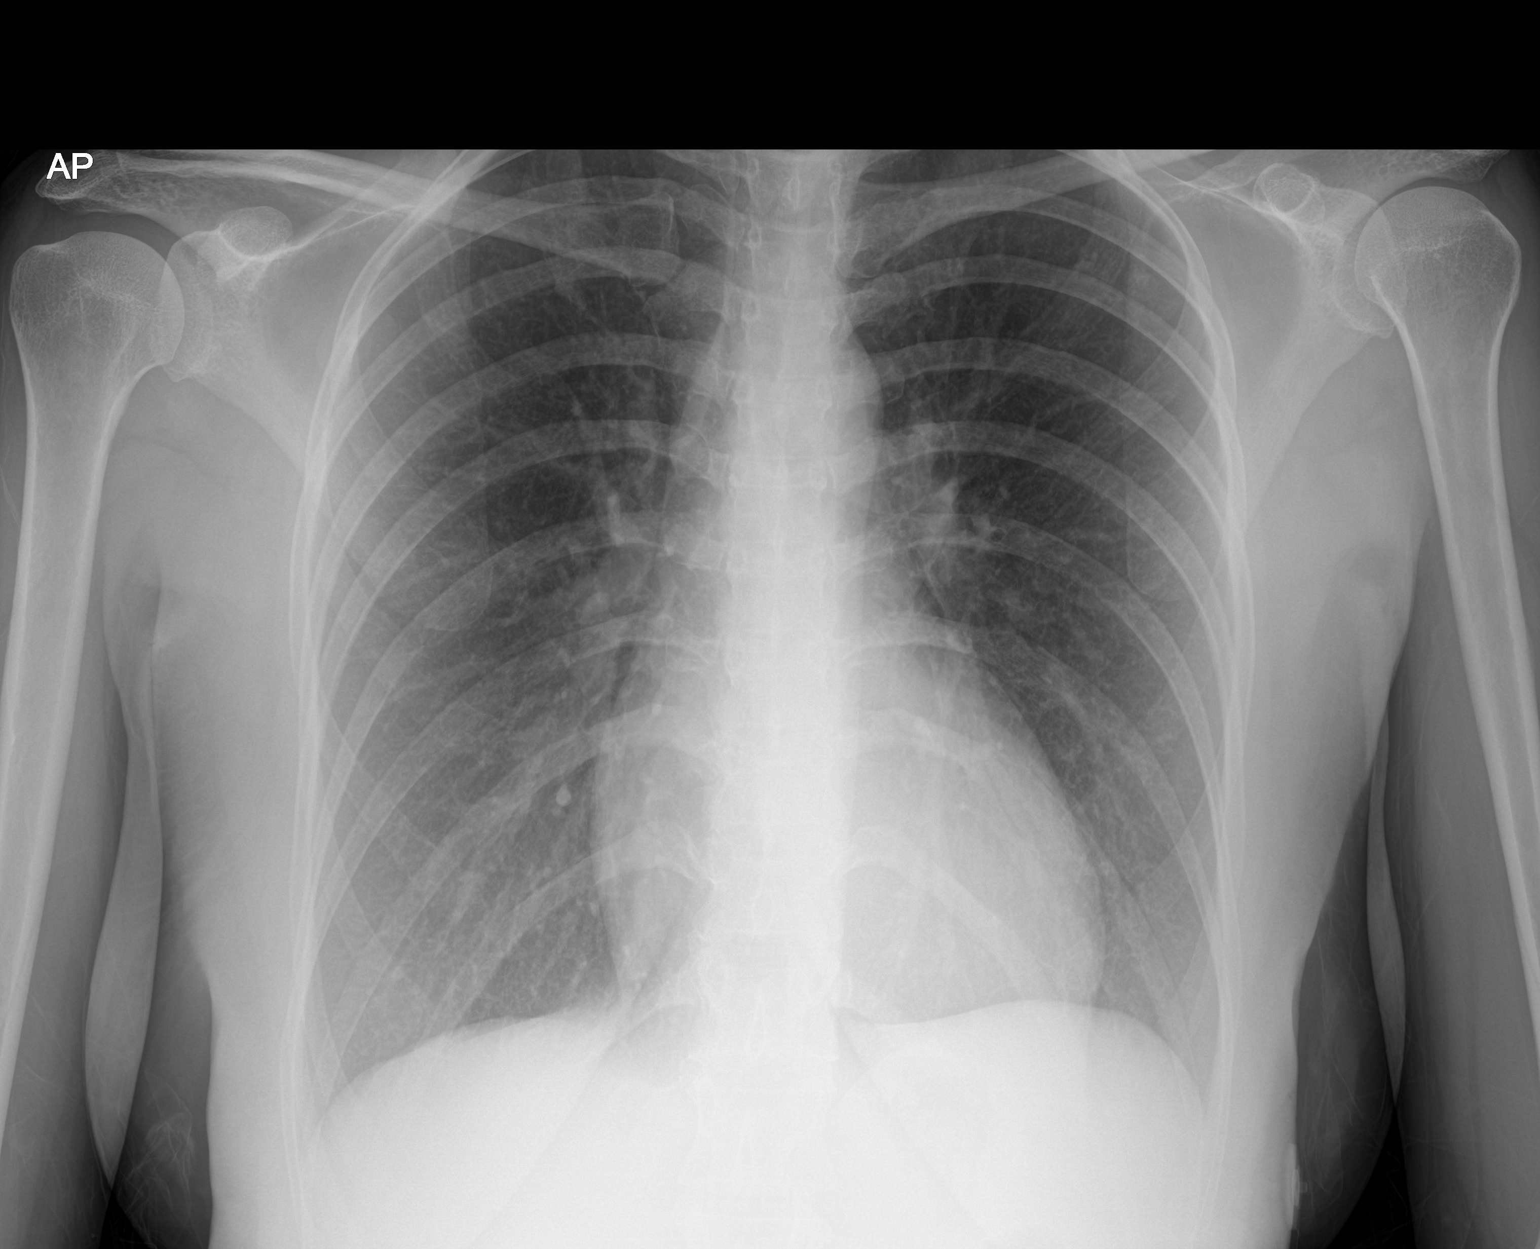

[1 of 1 positions shown; findings below may reference images not displayed]

FINDINGS: Normal heart size and mediastinal contours. Artifact from EKG leads.
No acute infiltrate or edema. No effusion or pneumothorax. No acute
osseous findings.
IMPRESSION: Negative chest.

## 2023-04-23 ENCOUNTER — Other Ambulatory Visit: Payer: Self-pay

## 2023-04-23 ENCOUNTER — Ambulatory Visit: Payer: 59 | Admitting: Family Medicine

## 2023-04-23 ENCOUNTER — Encounter: Payer: Self-pay | Admitting: Family Medicine

## 2023-04-23 VITALS — BP 118/72 | Ht 65.0 in | Wt 140.0 lb

## 2023-04-23 DIAGNOSIS — M25511 Pain in right shoulder: Secondary | ICD-10-CM

## 2023-04-23 DIAGNOSIS — M7531 Calcific tendinitis of right shoulder: Secondary | ICD-10-CM | POA: Diagnosis not present

## 2023-04-23 DIAGNOSIS — M7541 Impingement syndrome of right shoulder: Secondary | ICD-10-CM

## 2023-04-23 MED ORDER — METHYLPREDNISOLONE ACETATE 40 MG/ML IJ SUSP
40.0000 mg | Freq: Once | INTRAMUSCULAR | Status: AC
  Administered 2023-04-23: 40 mg via INTRA_ARTICULAR

## 2023-04-23 NOTE — Assessment & Plan Note (Signed)
Acute right shoulder pain times several months with MSK ultrasound today showing calcific tendinosis of the supraspinatus and subscapularis with mild dynamic impingement.  No significant rotator cuff tearing appreciated.  Plan: 1.  MSK ultrasound completed in the office today with findings as noted above.   2. Diagnosis and treatment reviewed in detail including: Watchful waiting versus cortisone injection versus PT versus ongoing chiropractic therapy.  After discussing risks and benefits, patient would like to proceed with cortisone injection.  See procedure note as noted above 3.  Can use over-the-counter NSAIDs as needed.  GI precautions discussed, should take with food 4.  Can continue with chiropractic therapy with [facility] as scheduled.  Will send a copy of our note to him 5.  Can use heat or ice as needed 6.  Provided with home exercise program today 7. Will follow-up with me in 6 weeks to assess her progress, if no improvement could consider formal physical therapy versus shockwave therapy. 8.  Expressed understanding agree with above

## 2023-04-23 NOTE — Progress Notes (Signed)
DATE OF VISIT: 04/23/2023        Alexa Goodwin DOB: [birth year] MRN: [id number]  CC:  Rt shoulder pain  History- Alexa Goodwin is a 41 y.o. Rt-hand dominant female for evaluation and treatment of Rt shoulder pain.  Referred by Ree Kida Philips  Pain x several months No injury/trauma Started with mild pain, but has been getting worse Pain along the front and the side Worse with over extending the arm Worse with overhead activity and reaching back (+)night pain No neck pain No numbness/tingling No medications for this Using ice prn Has tried 3 chiro sessions at this time - chiropractor thinks likely impingement  Remote shoulder issues years ago - improved with chiropractor Remote hx of xray years ago - showed inflammation  PMH significant for MS in 2002 - no medications - follows with neurology - last flare >1 year ago   Past Medical History Past Medical History:  Diagnosis Date   Multiple sclerosis (HCC)     Past Surgical History Past Surgical History:  Procedure Laterality Date   NO PAST SURGERIES      Medications Current Outpatient Medications  Medication Sig Dispense Refill   calcium carbonate (OS-CAL) 600 MG TABS Take 600 mg by mouth daily.     fish oil-omega-3 fatty acids 1000 MG capsule Take 2 g by mouth daily.     Multiple Vitamin (MULTIVITAMIN) tablet Take 1 tablet by mouth daily.     prenatal vitamin w/FE, FA (PRENATAL 1 + 1) 27-1 MG TABS Take 1 tablet by mouth daily.       vitamin B-12 (CYANOCOBALAMIN) 50 MCG tablet Take 50 mcg by mouth daily.     No current facility-administered medications for this visit.    Allergies has No Known Allergies.  Family History - reviewed per EMR and intake form  Social History   reports no history of alcohol use.  reports that she has never smoked. She has never used smokeless tobacco.  reports no history of drug use. 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  No midline or paraspinal tenderness.  Negative Spurling's bilaterally Shoulder: Right shoulder with near full range of motion with positive painful arc greater than 90 degrees.  No tenderness over the bicipital groove, greater tuberosity, AC joint.  Negative empty can.  Positive Hawkins, positive Neer, negative drop arm.  Rotator cuff strength 5/5 throughout Left shoulder with full range of motion without pain, weakness, instability Normal grip strength bilaterally  NEURO: sensation intact to light touch, DTR + 2/4 bicep, tricep, brachioradialis bilaterally VASC: pulses 2+ and symmetric radial artery bilaterally, no edema  IMAGING: MSK Complete US of Shoulder Location: Right shoulder Indication: Right shoulder pain rule out impingement or other abnormality  Patient was seated on exam table and shoulder US examination was performed using high frequency linear probe.  - The biceps tendon was visualized within the bicipital groove in both longitudinal and transverse axis with no signs of fluid or hypoechoic changes. Tendon fibers intact without signs of irregularity.  - The subscapularis tendon was visualized in both longitudinal and transverse axis with intact tendon inserting at the inferior lesser tubercle of the humerus.  There was small calcification at the  insertion point with minimal partial tearing, findings consistent with calcific tendinosis.  Other tears or abnormalities appreciated  - The supraspinatus tendon was visualized in longitudinal, transverse, and dynamic views.  Small calcification noted near the insertional footplate, minimal associated partial tearing in this area.  No other abnormalities or irregularity appreciated  - The infraspinatus and teres minor tendons were visualized in both  longitudinal and transverse axis with tendon insertion at the middle facet of the great tubercle of the humerus with no signs of tearing, hypoechoic changes or tissue irregularity seen.  - The posterior glenohumeral joint was visualized with proper alignment.  - The [facility] Joint was visualized without bursal distension or significant bony spurs/arthritic changes.  -Had mild dynamic subacromial impingement with minimal amount of fluid in the subacromial/subdeltoid bursa  IMPRESSION:  1.  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  Minimal subacromial/subdeltoid bursitis  Images saved.    Assessment & Plan Pain in joint of right shoulder  Calcific tendinitis of right shoulder  Shoulder impingement syndrome, right    Encounter Diagnoses  Name Primary?   Pain in joint of right shoulder Yes   Calcific tendinitis of right shoulder    Shoulder impingement syndrome, right     Orders Placed This Encounter  Procedures   [place] LIMITED JOINT SPACE STRUCTURES UP RIGHT    Orders Placed This Encounter  Procedures   [place] LIMITED JOINT SPACE STRUCTURES UP RIGHT

## 2023-04-23 NOTE — Progress Notes (Signed)
DATE OF VISIT: 04/23/2023        Alexa Goodwin DOB: 1980-10-11 MRN: [id number]  CC:  Rt shoulder pain  History- Alexa Goodwin is a 42 y.o. Rt-hand dominant female for evaluation and treatment of Rt shoulder pain.  Referred by Ree Kida Philips  Pain x several months No injury/trauma Started with mild pain, but has been getting worse Pain along the front and the side Worse with over extending the arm Worse with overhead activity and reaching back (+)night pain No neck pain No numbness/tingling No medications for this Using ice prn Has tried 3 chiro sessions at this time - chiropractor thinks likely impingement  Remote shoulder issues years ago - improved with chiropractor Remote hx of xray years ago - showed inflammation  PMH significant for MS in 2002 - no medications - follows with neurology - last flare >1 year ago   Past Medical History Past Medical History:  Diagnosis Date   Multiple sclerosis (HCC)     Past Surgical History Past Surgical History:  Procedure Laterality Date   NO PAST SURGERIES      Medications Current Outpatient Medications  Medication Sig Dispense Refill   calcium carbonate (OS-CAL) 600 MG TABS Take 600 mg by mouth daily.     fish oil-omega-3 fatty acids 1000 MG capsule Take 2 g by mouth daily.     Multiple Vitamin (MULTIVITAMIN) tablet Take 1 tablet by mouth daily.     prenatal vitamin w/FE, FA (PRENATAL 1 + 1) 27-1 MG TABS Take 1 tablet by mouth daily.       vitamin B-12 (CYANOCOBALAMIN) 50 MCG tablet Take 50 mcg by mouth daily.     No current facility-administered medications for this visit.    Allergies has No Known Allergies.  Family History - reviewed per EMR and intake form  Social History   reports no history of alcohol use.  reports that she has never smoked. She has never used smokeless tobacco.  reports no history of drug use. 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  No midline or paraspinal tenderness.  Negative Spurling's bilaterally Shoulder: Right shoulder with near full range of motion with positive painful arc greater than 90 degrees.  No tenderness over the bicipital groove, greater tuberosity, AC joint.  Negative empty can.  Positive Hawkins, positive Neer, negative drop arm.  Rotator cuff strength 5/5 throughout Left shoulder with full range of motion without pain, weakness, instability Normal grip strength bilaterally  NEURO: sensation intact to light touch, DTR + 2/4 bicep, tricep, brachioradialis bilaterally VASC: pulses 2+ and symmetric radial artery bilaterally, no edema  IMAGING: MSK Complete US of Shoulder Location: Right shoulder Indication: Right shoulder pain rule out impingement or other abnormality  Patient was seated on exam table and shoulder US examination was performed using high frequency linear probe.  - The biceps tendon was visualized within the bicipital groove in both longitudinal and transverse axis with no signs of fluid or hypoechoic changes. Tendon fibers intact without signs of irregularity.  - The subscapularis tendon was visualized in both longitudinal and transverse axis with intact tendon inserting at the inferior lesser tubercle of the humerus.  There was small calcification at the  insertion point with minimal partial tearing, findings consistent with calcific tendinosis.  Other tears or abnormalities appreciated  - The supraspinatus tendon was visualized in longitudinal, transverse, and dynamic views.  Small calcification noted near the insertional footplate, minimal associated partial tearing in this area.  No other abnormalities or irregularity appreciated  - The infraspinatus and teres minor tendons were visualized in both  longitudinal and transverse axis with tendon insertion at the middle facet of the great tubercle of the humerus with no signs of tearing, hypoechoic changes or tissue irregularity seen.  - The posterior glenohumeral joint was visualized with proper alignment.  - The [facility] Joint was visualized without bursal distension or significant bony spurs/arthritic changes.  -Had mild dynamic subacromial impingement with minimal amount of fluid in the subacromial/subdeltoid bursa  IMPRESSION:  1.  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  Minimal subacromial/subdeltoid bursitis  Images saved.    Assessment & Plan Pain in joint of right shoulder Acute right shoulder pain times several months with MSK ultrasound today showing calcific tendinosis of the supraspinatus and subscapularis with mild dynamic impingement.  No significant rotator cuff tearing appreciated.  Plan: 1.  MSK ultrasound completed in the office today with findings as noted above.   2. Diagnosis and treatment reviewed in detail including: Watchful waiting versus cortisone injection versus PT versus ongoing chiropractic therapy.  After discussing risks and benefits, patient would like to proceed with cortisone injection.  PROCEDURE:  Risks & benefits of right shoulder subacromial injection reviewed. Written consent obtained. Time-out completed. Patient prepped and draped in the normal fashion. Area cleansed with alcohol. Ethyl chloride spray used to anesthetize the skin. Solution of 4 mL 1% lidocaine with 1 mL methylprednisolone (Depo-medrol) 40mg /mL injected into the right subacromial space using a 25-gauge 1.5-inch needle via the posterior approach. Patient tolerated procedure well without any complications. Area covered with adhesive bandage. Post-procedure care reviewed. All questions answered.  3.  Can use over-the-counter NSAIDs as needed.  GI precautions discussed, should take  with food 4.  Can continue with chiropractic therapy with [facility] as scheduled.  Will send a copy of our note to him 5.  Can use heat or ice as needed 6.  Provided with home exercise program today 7. Will follow-up with me in 6 weeks to assess her progress, if no improvement could consider formal physical therapy versus shockwave therapy. 8.  Expressed understanding agree with above  Calcific tendinitis of right shoulder Acute right shoulder pain times several months with MSK ultrasound today showing calcific tendinosis of the supraspinatus and subscapularis with mild dynamic impingement.  No significant rotator cuff tearing appreciated.  Plan: 1.  MSK ultrasound completed in the office today with findings as noted above.   2. Diagnosis and treatment reviewed in detail including: Watchful waiting versus cortisone injection versus PT versus ongoing chiropractic therapy.  After discussing risks and benefits, patient would like to proceed with cortisone injection.  See procedure note as noted above 3.  Can use over-the-counter NSAIDs as needed.  GI precautions discussed, should take with food 4.  Can continue with chiropractic therapy with [facility] as scheduled.  Will send a copy of our note to him 5.  Can use heat or ice as needed 6.  Provided with home exercise program today 7. Will follow-up with me in 6 weeks to assess her progress, if no improvement could consider formal physical therapy versus shockwave therapy. 8.  Expressed understanding agree with  above  Shoulder impingement syndrome, right Acute right shoulder pain times several months with MSK ultrasound today showing calcific tendinosis of the supraspinatus and subscapularis with mild dynamic impingement.  No significant rotator cuff tearing appreciated.  Plan: 1.  MSK ultrasound completed in the office today with findings as noted above.   2. Diagnosis and treatment reviewed in detail including: Watchful waiting  versus cortisone injection versus PT versus ongoing chiropractic therapy.  After discussing risks and benefits, patient would like to proceed with cortisone injection.  See procedure note as noted above 3.  Can use over-the-counter NSAIDs as needed.  GI precautions discussed, should take with food 4.  Can continue with chiropractic therapy with [facility] as scheduled.  Will send a copy of our note to him 5.  Can use heat or ice as needed 6.  Provided with home exercise program today 7. Will follow-up with me in 6 weeks to assess her progress, if no improvement could consider formal physical therapy versus shockwave therapy. 8.  Expressed understanding agree with above   Encounter Diagnoses  Name Primary?   Pain in joint of right shoulder Yes   Calcific tendinitis of right shoulder    Shoulder impingement syndrome, right     Orders Placed This Encounter  Procedures   [place] LIMITED JOINT SPACE STRUCTURES UP RIGHT    Orders Placed This Encounter  Procedures   [place] LIMITED JOINT SPACE STRUCTURES UP RIGHT

## 2023-04-23 NOTE — Patient Instructions (Signed)
You have rotator cuff impingement due to calcific tendinosis (small calcifications in your rotator cuff tendon) that is causing your pain. We did a cortisone injection today to help with your symptoms Try to avoid painful activities (overhead activities, lifting with extended arm) as much as possible. You can take over the counter Naproxen or Ibuprofen as needed with food for pain and inflammation as needed. Can take tylenol in addition to this. Do home exercise program and scapular stabilization exercises daily 3 sets of 10 once a day. You can continue your chiropractic treatments If not improving at follow-up we will consider further imaging, injection, physical therapy, and/or nitro patches. Follow up with me in 6 weeks  INJECTION INFO Today you received an injection with a corticosteroid (aka: cortisone injection). This injection is usually done in response to pain and inflammation. There is some "numbing medicine" (Lidocaine) in the shot, so the injected area may be numb and feel really good for the next couple of hours. The numbing medicine usually wears off in 2-3 hours, and then your pain level may be back to where it was before the injection until the cortisone starts working.    The actually benefit from the steroid injection is usually noticed within 3-5 days, but may take up to 14 days. You may actually experience a small (as in 10%) INCREASE in pain in the first 24 hours---that is common.  Things to watch out for that you should contact us or a health care provider urgently would include: 1. Unusual (as in more than 10%) increase in pain 2. New fever > 101.5 3. New swelling or redness of the injected area. 4. Streaking of red lines around the area injected.  Do not hesitate to call or reach out with any questions or concerns. Marland Kitchen

## 2023-05-14 ENCOUNTER — Ambulatory Visit
Admission: RE | Admit: 2023-05-14 | Discharge: 2023-05-14 | Disposition: A | Discharge status: Home / Self Care | Payer: 59 | Source: Ambulatory Visit | Attending: Family Medicine | Admitting: Family Medicine

## 2023-05-14 ENCOUNTER — Ambulatory Visit: Payer: 59 | Admitting: Family Medicine

## 2023-05-14 ENCOUNTER — Encounter: Payer: Self-pay | Admitting: Family Medicine

## 2023-05-14 VITALS — BP 110/60 | Ht 65.0 in | Wt 140.0 lb

## 2023-05-14 DIAGNOSIS — M25511 Pain in right shoulder: Secondary | ICD-10-CM | POA: Diagnosis not present

## 2023-05-14 MED ORDER — METHOCARBAMOL 750 MG PO TABS: ORAL_TABLET | ORAL | 1 refills | Status: AC

## 2023-05-14 MED ORDER — PREDNISONE 10 MG PO TABS: ORAL_TABLET | ORAL | 0 refills | Status: DC

## 2023-05-14 NOTE — Progress Notes (Signed)
DATE OF VISIT: 05/14/2023        Alexa Goodwin DOB: [birth year] MRN: [id number]  CC:  f/u Rt shoulder  History of present Illness: Alexa Goodwin is a 42 y.o. female who presents for a follow-up visit for Rt shoulder pain with calcific tendonitis  Last seen 04/23/23 - underwent subacromial inj at that time - Does not think helped Not sleeping well - Took Advil PM last night  Still doing chiropractic tx - worse during and after treatments Feels like shoulder has been getting worse No new injury/trauma  Medications:  Outpatient Encounter Medications as of 05/14/2023  Medication Sig   methocarbamol (ROBAXIN-750) 750 MG tablet Take one pill qhs   predniSONE (DELTASONE) 10 MG tablet Take as directed per MD instructions   calcium carbonate (OS-CAL) 600 MG TABS Take 600 mg by mouth daily.   fish oil-omega-3 fatty acids 1000 MG capsule Take 2 g by mouth daily.   Multiple Vitamin (MULTIVITAMIN) tablet Take 1 tablet by mouth daily.   prenatal vitamin w/FE, FA (PRENATAL 1 + 1) 27-1 MG TABS Take 1 tablet by mouth daily.     vitamin B-12 (CYANOCOBALAMIN) 50 MCG tablet Take 50 mcg by mouth daily.   No facility-administered encounter medications on file as of 05/14/2023.    Allergies: has No Known Allergies.  Physical Examination: Vitals: BP 110/60   Ht 5\' 5"  (1.651 m)   Wt 140 lb (63.5 kg)   BMI 23.30 kg/m  GENERAL:  Alexa Goodwin is a 42 y.o. female appearing their stated age, alert and oriented x 3, in no apparent distress.  SKIN: no rashes or lesions, skin clean, dry, intact MSK:  Shoulder: Right shoulder with near full range of motion with positive painful arc greater than 90 degrees.  Mild tenderness over the bicipital groove, mild tenderness greater tuberosity, no tenderness AC joint.  (+) empty can.  Positive Hawkins, positive Neer, negative drop arm.  Rotator cuff strength 5-/5 throughout with associated pain Left shoulder with full range of motion without pain, weakness,  instability Normal grip strength bilaterally NEURO: sensation intact to light touch upper ext b/l  Radiology: ULTRASOUND: MSK U/S 04/23/23 Rt shoulder by me showing: IMPRESSION:  1.  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  Minimal subacromial/subdeltoid bursitis   Assessment & Plan Pain in joint of right shoulder Worsening Rt shoulder pain that has been present x several months, now worsening over the last 3 weeks - no improvement with subacromial cortisone injection by me 04/23/23 - no improvement with PO NSAIDs x 6+ weeks - no improvement with chiropractic therapy x 6+ weeks - no improvement with provider-directed therapy and HEP x 6+ weeks  PLAN: - MRI Rt shoulder r/o RTC tear, calcific tendonitis, other abnormality.  Has not been improving with conservative therapy as noted above.  She would be interested in surgery if needed.  No prior xray, but MSK u/s completed 04/23/23.  Order for Rt shoulder XR placed should it be needed for MRI approval - Rx for Prednisone taper to take as directed x 6 days - Rx Robaxin 750mg  po q hs prn pain/spasm.  Cautioned that can cause drowsiness so should only use at bedtime. - cont heat or ice prn - should hold with PT at this time - f/u pending MRI to discuss results and further treatment   Patient expressed understanding & agreement with above.  Encounter Diagnosis  Name Primary?   Pain in joint of right shoulder Yes  Orders Placed This Encounter  Procedures   MR SHOULDER RIGHT WO CONTRAST   DG Shoulder Right

## 2023-05-14 NOTE — Patient Instructions (Addendum)
You are having ongoing pain of your shoulder - I sent a prescription for a steroid pack to take for the next 6 days to help with pain/inflammation.  You should take with food - I sent a prescription for Robaxin (a muscle relaxant) to take at bedtime.  This may cause drowsiness, so you should only take at bedtime - we are ordering and MRI of your shoulder to further evaluate why you are having pain.  You may need an xray before - we ordered this also  [facility] [address], [address], [place], [place] [address] Phone: [phone]   - please reach out 5-7 days after the MRI if you have not heard from [place] with your results

## 2023-05-20 ENCOUNTER — Encounter: Payer: Self-pay | Admitting: Family Medicine

## 2023-05-24 ENCOUNTER — Ambulatory Visit (HOSPITAL_BASED_OUTPATIENT_CLINIC_OR_DEPARTMENT_OTHER)
Admission: RE | Admit: 2023-05-24 | Discharge: 2023-05-24 | Disposition: A | Discharge status: Home / Self Care | Payer: 59 | Source: Ambulatory Visit | Attending: Family Medicine | Admitting: Family Medicine

## 2023-05-24 DIAGNOSIS — M25511 Pain in right shoulder: Secondary | ICD-10-CM | POA: Diagnosis present

## 2023-06-11 ENCOUNTER — Ambulatory Visit: Payer: 59 | Admitting: Family Medicine

## 2023-06-11 ENCOUNTER — Other Ambulatory Visit: Payer: Self-pay

## 2023-06-11 ENCOUNTER — Encounter: Payer: Self-pay | Admitting: Family Medicine

## 2023-06-11 VITALS — BP 106/78 | Ht 65.0 in | Wt 145.0 lb

## 2023-06-11 DIAGNOSIS — M7501 Adhesive capsulitis of right shoulder: Secondary | ICD-10-CM

## 2023-06-11 MED ORDER — METHYLPREDNISOLONE ACETATE 40 MG/ML IJ SUSP
40.0000 mg | Freq: Once | INTRAMUSCULAR | Status: AC
  Administered 2023-06-11: 40 mg via INTRA_ARTICULAR

## 2023-06-11 NOTE — Patient Instructions (Signed)
You have a frozen shoulder (adhesive capsulitis), a buildup of scar tissue that limits motion of the shoulder joint. - this is was is leading to your pain  Today you received an injection with a corticosteroid (aka: cortisone injection). This injection is usually done in response to pain and inflammation. There is some "numbing medicine" (Lidocaine) in the shot, so the injected area may be numb and feel really good for the next couple of hours. The numbing medicine usually wears off in 2-3 hours, and then your pain level may be back to where it was before the injection until the cortisone starts working.    The actually benefit from the steroid injection is usually noticed within 3-5 days, but may take up to 14 days. You may actually experience a small (as in 10%) INCREASE in pain in the first 24 hours---that is common.  Things to watch out for that you should contact us or a health care provider urgently would include: 1. Unusual (as in more than 10%) increase in pain 2. New fever > 101.5 3. New swelling or redness of the injected area. 4. Streaking of red lines around the area injected.  The following recommendations will be helpful for your recovery: - Limit lifting and overhead activities as much as possible. - Heat 15 minutes at a time 3-4 times a day may help with movement and stiffness. - Tylenol and/or aleve as needed for pain and inflammation. - I placed a physical therapy referral for you.  This is one of the most important treatments to help decrease your pain and get your motion back - Motion exercises (pendulum, wall walking or table slides, arm circles) - do 3 sets of 10 once or twice a day. - Follow up in 6 weeks

## 2023-06-11 NOTE — Progress Notes (Signed)
DATE OF VISIT: 06/11/2023        Alexa Goodwin DOB: 1980/09/08 MRN: [id number]  CC:  f/u Rt shoulder  History of present Illness: Alexa Goodwin is a 42 y.o. female who presents for a follow-up visit for right shoulder pain.  Last seen 05/14/23 Had MRI after last visit showing: MRI Rt shoulder 05/29/23 IMPRESSION: 1. Intact rotator cuff. 2. Very mild degenerative changes of the acromioclavicular joint. 3. Trace fluid within the subacromial/subdeltoid bursa.  No new injury Feels like has been getting worse Now with decreased ROM No improvement with Advil No improvement with heat/ice  Medications:  Outpatient Encounter Medications as of 06/11/2023  Medication Sig   calcium carbonate (OS-CAL) 600 MG TABS Take 600 mg by mouth daily.   fish oil-omega-3 fatty acids 1000 MG capsule Take 2 g by mouth daily.   methocarbamol (ROBAXIN-750) 750 MG tablet Take one pill qhs   Multiple Vitamin (MULTIVITAMIN) tablet Take 1 tablet by mouth daily.   prenatal vitamin w/FE, FA (PRENATAL 1 + 1) 27-1 MG TABS Take 1 tablet by mouth daily.     vitamin B-12 (CYANOCOBALAMIN) 50 MCG tablet Take 50 mcg by mouth daily.   [DISCONTINUED] predniSONE (DELTASONE) 10 MG tablet Take as directed per MD instructions   [EXPIRED] methylPREDNISolone acetate (DEPO-MEDROL) injection 40 mg    No facility-administered encounter medications on file as of 06/11/2023.    Allergies: has No Known Allergies.  Physical Examination: Vitals: BP 106/78   Ht 5\' 5"  (1.651 m)   Wt 145 lb (65.8 kg)   BMI 24.13 kg/m  GENERAL:  Alexa Goodwin is a 42 y.o. female appearing their stated age, alert and oriented x 3, in no apparent distress.  SKIN: no rashes or lesions, skin clean, dry, intact MSK: Right shoulder with decreased range of motion actively and passively in all planes, forward flexion to approximately 115 degrees, abduction to 90 degrees, internal rotation to iliac crest, external rotation 10 degrees.  Tender to palpation over  the bicipital groove, no tenderness over the [facility] joint, mild tenderness over the greater tuberosity.  Positive empty can, positive Hawkins, positive Neer.  Rotator cuff strength 4+/5 throughout with associated pain. Left shoulder with full range of motion without pain or weakness Neurovascularly intact distally  Radiology: MRI Rt shoulder 05/29/23 IMPRESSION: 1. Intact rotator cuff. 2. Very mild degenerative changes of the acromioclavicular joint. 3. Trace fluid within the subacromial/subdeltoid bursa.  Assessment & Plan Adhesive capsulitis of right shoulder Worsening right shoulder pain, now with exam consistent with frozen shoulder  Plan: -Diagnosis and treatment discussed -MRI findings from 05/28/2021 reviewed, no significant abnormalities aside from trace bursitis and AC joint degenerative changes -Discussed treatment options for frozen shoulder including PT, ultrasound-guided glenohumeral injection.  She would like to proceed with injection, as well as formal PT referral today.  Injection was completed, please see procedure note as noted below -Referral to formal PT at [facility] given -Instructed on home exercise program to start prior to formal PT -Advised current condition will take likely several months to resolve, could even potentially take upwards of a year -Follow-up 6 weeks for reevaluation, sooner as needed -Patient and husband expressed understanding and agreement  PROCEDURE:  Risks & benefits of RT shoulder u/s guided GH joint injection reviewed. Consent obtained. Time-out completed. Patient prepped and draped in the normal fashion. Musculoskeletal ultrasound used to identify appropriate anatomy. RT GH joint well identified posteriorly, no signs of effusion. After identifying appropriate anatomy, patient positioned &  area cleansed with chlorhexadine. Ethyl chloride spray used to anesthetize the skin. Solution of 3 mL 1% lidocaine injected under u/s  guidance for local anesthesia using 25-gauge 1.5 inch needle. After ensuring adequate anesthesia a solution of 7 mL 1% lidocaine with 1 mL methylprednisolone (Depo-medrol) 40mg /mL injected into the RT glenohumeral joint using a 22-gauge 3.5-inch needle via posterior approach under ultrasound guidance. Needle well-visualized in the joint. Images saved. Patient tolerated procedure well without any complications.  Area covered with adhesive bandage. Post-procedure care reviewed, all questions answered.  Had immediate improvement of pain following the injection, but still limited range of motion     Patient expressed understanding & agreement with above.  Encounter Diagnosis  Name Primary?   Adhesive capsulitis of right shoulder Yes    Orders Placed This Encounter  Procedures   [place] LIMITED JOINT SPACE STRUCTURES UP RIGHT   Ambulatory referral to Physical Therapy

## 2023-06-15 ENCOUNTER — Encounter: Payer: Self-pay | Admitting: Family Medicine

## 2023-07-02 ENCOUNTER — Ambulatory Visit: Payer: 59

## 2023-07-06 NOTE — Therapy (Signed)
 OUTPATIENT PHYSICAL THERAPY SHOULDER EVALUATION   Patient Name: Alexa Goodwin MRN: [id number] DOB:1980/09/04, 42 y.o., female Today's Date: 07/15/2023  END OF SESSION:  PT End of Session - 07/15/23 1536     Visit Number 1    Date for PT Re-Evaluation 09/09/23    Authorization Type UHC    PT Start Time 1532    PT Stop Time 1624    PT Time Calculation (min) 52 min    Activity Tolerance Patient tolerated treatment well    Behavior During Therapy [facility] for tasks assessed/performed             Past Medical History:  Diagnosis Date   Multiple sclerosis (HCC)    Past Surgical History:  Procedure Laterality Date   NO PAST SURGERIES     Patient Active Problem List   Diagnosis Date Noted   Calcific tendinitis of right shoulder 04/23/2023    PCP: Patient, No Pcp Per    REFERRING PROVIDER: Teressa Rainell BROCKS, DO  REFERRING DIAG: M75.01 (ICD-10-CM) - Adhesive capsulitis of right shoulder   THERAPY DIAG:  Acute pain of right shoulder  Stiffness of right shoulder, not elsewhere classified  Rationale for Evaluation and Treatment: Rehabilitation  ONSET DATE: ~ June 2024  SUBJECTIVE:                                                                                                                                                                                      SUBJECTIVE STATEMENT: After X-ray, US  and MRI they figured out that it was calcification turning into severe adhesive capsulitis.  She notices one day she coudnt reach back to hand stuff in car and gradually got worse.  Hand dominance: Right  PERTINENT HISTORY: From MD notes She has experienced symptoms for approximately 6 months. Specifically, she describes progressive worsening shoulder pain and range of motion deficits. She has difficulty raising her arm overhead and reaching behind the back. She also experiences night pain which interferes with sleep. Symptoms are affecting activities of daily living.  MRI described  intact rotator cuff, diagnosed with adhesive capsulitis and given depo-medrol  injection to right GH joint on 06/11/23 by sports medicine doctor, second opinion by orthopedic surgeon confirmed diagnosis and also recommended PT.    PMH: MS  PAIN:  Are you having pain? Yes: NPRS scale: 0-10/10 Pain location: R shoulder Pain description: sharp, shoots down to elbow Aggravating factors: using it, reaching, changing positions at night, sleeping on it Relieving factors: not moving it  PRECAUTIONS: None  RED FLAGS: None   WEIGHT BEARING RESTRICTIONS: No  FALLS:  Has patient fallen in last 6 months? 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 Intact rotator cuff. 2. Very mild degenerative changes of the acromioclavicular joint. 3. Trace fluid within the subacromial/subdeltoid bursa.  PATIENT SURVEYS:  Quick Dash 52.3%  COGNITION: Overall cognitive status: Within functional limits for tasks assessed     SENSATION: WFL  POSTURE: WNL  UPPER EXTREMITY ROM:   Active ROM Right eval Left eval  Shoulder flexion 100p! 155  Shoulder extension 65p! 80  Shoulder abduction 80p! 165  Shoulder adduction    Shoulder internal rotation 40p! * 80 *  Shoulder external rotation 25p! * 85 *  Elbow flexion    Elbow extension    Wrist flexion    Wrist extension    (Blank rows = not tested) *passive, measured in supine at 60 degs of abduction.   UPPER EXTREMITY MMT:  MMT Right eval Left eval  Shoulder flexion 5 5  Shoulder extension    Shoulder abduction 5 5  Shoulder internal rotation 5 5  Shoulder external rotation 5 5  Elbow flexion 5 5  Elbow extension 5 5   Wrist flexion 5 5  Wrist extension 5 5  Grip strength (lbs) good good  (Blank rows = not tested)  JOINT MOBILITY TESTING:  Significant tightness in GH capsule  PALPATION:  Tenderness along biceps, tightness UT   TODAY'S TREATMENT:                                                                                                                                         DATE:   07/15/2023 EVAL Manual Therapy: to decrease muscle spasm and pain and improve mobility Mobs to [facility] joint, PROM into ER and flexion Therapeutic Exercise: to improve strength and mobility.  Demo, verbal and tactile cues throughout for technique. ER stretch with dowel - demo - performing at home Pulley AROM flexion, scaption and IR - demo   PATIENT EDUCATION: Education details: initial HEP Person educated: Patient Education method: Explanation, Facilities Manager, and Handouts Education comprehension: verbalized understanding  HOME EXERCISE PROGRAM: Access Code: [email or web address] URL: [email or web address] Date: 07/15/2023 Prepared by: Almarie Sprinkles  Exercises - Seated Shoulder Flexion AAROM with Pulley Behind  - 3 x daily - 7 x weekly - 3 sets - 10 reps - Seated Shoulder Scaption AAROM with Pulley at Side  - 3 x daily - 7 x weekly - 3 sets - 10 reps - Standing Shoulder Internal Rotation AAROM with Pulley  - 3 x daily - 7 x weekly - 3 sets - 10 reps  ASSESSMENT:  CLINICAL IMPRESSION: Alexa Goodwin  is a 42 y.o. right hand dominant female  who was seen today for physical therapy evaluation and treatment for R shoulder adhesive capsulitis.  Examination results consistent with referral, with Adelai demonstrating R shoulder pain and significant limitations in R shoulder active and passive ROM, but no deficits in R shoulder strength.    Tejasvi Mccoll would benefit from skilled therapy to improve R shoulder ROM, decrease pain and improve functional mobility and ability to perform ADLs.  Today started manual therapy  with significant improvement in shoulder external rotation, measuring 65 deg afterwards.  Also given initial HEP, she will purchase arm pulleys from [facility] for gentle stretches for flexion, scaption and IR.    OBJECTIVE IMPAIRMENTS: decreased activity tolerance, decreased endurance, decreased mobility, decreased ROM, increased fascial restrictions, impaired perceived functional ability, increased muscle spasms, impaired flexibility, impaired UE functional use, and pain.   ACTIVITY LIMITATIONS: carrying, lifting, bending, sleeping, transfers, bed mobility, dressing, reach over head, and caring for others  PARTICIPATION LIMITATIONS: meal prep, cleaning, driving, community activity, and occupation  PERSONAL FACTORS: Time since onset of injury/illness/exacerbation and 1-2 comorbidities: adhesive capsulitis, MS  are also affecting patient's functional outcome.   REHAB POTENTIAL: Good  CLINICAL DECISION MAKING: Stable/uncomplicated  EVALUATION COMPLEXITY: Low   GOALS: Goals reviewed with patient? Yes  SHORT TERM GOALS: Target date: 07/29/2023   Patient will be independent with initial HEP.  Baseline:  Goal status: INITIAL  LONG TERM GOALS: Target date: 09/09/2023   Patient will be independent with advanced/ongoing HEP to improve outcomes and carryover.  Baseline:  Goal status: INITIAL  2.  Patient will report 75% improvement in Right shoulder pain to improve QOL.  Baseline: 10/10 with overhead use Goal status: INITIAL  3.  Patient to improve R shoulder AROM to WNL without pain provocation to allow for increased ease of ADLs.  Baseline: see objective Goal status: INITIAL  4.  Patient will report at least 10 points improvement on QuickDash to demonstrate improved functional ability.  Baseline: 52.3% Goal status: INITIAL  5.  Patient will be able to lift 15lbs without increased R shoulder pain to cook.     Baseline: lifting pots increases R shoulder pain Goal status: INITIAL     PLAN:  PT FREQUENCY: 1-2x/week 1x per patient preference  PT DURATION: 8 weeks  PLANNED INTERVENTIONS: 97110-Therapeutic exercises, 97530- Therapeutic activity, [id number]- Neuromuscular re-education, 97535- Self Care, [id number]- Manual therapy, 97035- Ultrasound, 02966- Ionotophoresis 4mg /ml Dexamethasone, Patient/Family education, Taping, Dry Needling, Joint mobilization, Joint manipulation, Cryotherapy, and Moist heat  PLAN FOR NEXT SESSION: continue GH joint mobs, PROM to tolerance, progress HEP for stretching.  Modalities PRN.    Almarie JINNY Sprinkles, PT, DPT 07/15/2023, 4:44 PM

## 2023-07-15 ENCOUNTER — Encounter: Payer: Self-pay | Admitting: Physical Therapy

## 2023-07-15 ENCOUNTER — Other Ambulatory Visit: Payer: Self-pay

## 2023-07-15 ENCOUNTER — Ambulatory Visit: Payer: 59 | Attending: Family Medicine | Admitting: Physical Therapy

## 2023-07-15 DIAGNOSIS — M25511 Pain in right shoulder: Secondary | ICD-10-CM | POA: Insufficient documentation

## 2023-07-15 DIAGNOSIS — M7501 Adhesive capsulitis of right shoulder: Secondary | ICD-10-CM | POA: Diagnosis not present

## 2023-07-15 DIAGNOSIS — M25611 Stiffness of right shoulder, not elsewhere classified: Secondary | ICD-10-CM | POA: Diagnosis present

## 2023-07-22 ENCOUNTER — Ambulatory Visit: Payer: 59

## 2023-07-22 DIAGNOSIS — M25511 Pain in right shoulder: Secondary | ICD-10-CM | POA: Diagnosis not present

## 2023-07-22 DIAGNOSIS — M25611 Stiffness of right shoulder, not elsewhere classified: Secondary | ICD-10-CM

## 2023-07-22 NOTE — Therapy (Signed)
 OUTPATIENT PHYSICAL THERAPY SHOULDER TREATMENT   Patient Name: Magan Degenhart MRN: [id number] DOB:Jul 01, 1981, 43 y.o., female Today's Date: 07/22/2023  END OF SESSION:  PT End of Session - 07/22/23 1359     Visit Number 2    Date for PT Re-Evaluation 09/09/23    Authorization Type UHC    PT Start Time 1313    PT Stop Time 1356    PT Time Calculation (min) 43 min    Activity Tolerance Patient tolerated treatment well    Behavior During Therapy WFL for tasks assessed/performed              Past Medical History:  Diagnosis Date   Multiple sclerosis (HCC)    Past Surgical History:  Procedure Laterality Date   NO PAST SURGERIES     Patient Active Problem List   Diagnosis Date Noted   Calcific tendinitis of right shoulder 04/23/2023    PCP: Patient, No Pcp Per    REFERRING PROVIDER: Rodgers Clack, DO  REFERRING DIAG: M75.01 (ICD-10-CM) - Adhesive capsulitis of right shoulder   THERAPY DIAG:  Acute pain of right shoulder  Stiffness of right shoulder, not elsewhere classified  Rationale for Evaluation and Treatment: Rehabilitation  ONSET DATE: ~ June 2024  SUBJECTIVE:                                                                                                                                                                                      SUBJECTIVE STATEMENT: Pt reports increased pain today no MOI but constant throbbing along the anterior shoulder.  Hand dominance: Right  PERTINENT HISTORY: From MD notes "She has experienced symptoms for approximately 6 months. Specifically, she describes progressive worsening shoulder pain and range of motion deficits. She has difficulty raising her arm overhead and reaching behind the back. She also experiences night pain which interferes with sleep. Symptoms are affecting activities of daily living.  MRI described intact rotator cuff, diagnosed with adhesive capsulitis and given depo-medrol  injection to right GH joint  on 06/11/23 by sports medicine doctor, second opinion by orthopedic surgeon confirmed diagnosis and also recommended PT.    PMH: MS  PAIN:  Are you having pain? Yes: NPRS scale: 5/10 Pain location: R shoulder Pain description: sharp,throbbing shoots down to elbow Aggravating factors: using it, reaching, changing positions at night, sleeping on it Relieving factors: not moving it  PRECAUTIONS: None  RED FLAGS: None   WEIGHT BEARING RESTRICTIONS: No  FALLS:  Has patient fallen in last 6 months? 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 Intact rotator cuff. 2. Very mild degenerative changes of the acromioclavicular joint. 3. Trace fluid within the subacromial/subdeltoid bursa.  PATIENT SURVEYS:  Quick Dash 52.3%  COGNITION: Overall cognitive status: Within functional limits for tasks assessed     SENSATION: WFL  POSTURE: WNL  UPPER EXTREMITY ROM:   Active ROM Right eval Left eval  Shoulder flexion 100p! 155  Shoulder extension 65p! 80  Shoulder abduction 80p! 165  Shoulder adduction    Shoulder internal rotation 40p! * 80 *  Shoulder external rotation 25p! * 85 *  Elbow flexion    Elbow extension    Wrist flexion    Wrist extension    (Blank rows = not tested) *passive, measured in supine at 60 degs of abduction.   UPPER EXTREMITY MMT:  MMT Right eval Left eval  Shoulder flexion 5 5  Shoulder extension    Shoulder abduction 5 5  Shoulder internal rotation 5 5  Shoulder external rotation 5 5  Elbow flexion 5 5  Elbow extension 5 5  Wrist flexion 5 5  Wrist extension 5 5  Grip strength (lbs) good good  (Blank rows = not  tested)  JOINT MOBILITY TESTING:  Significant tightness in GH capsule  PALPATION:  Tenderness along biceps, tightness UT   TODAY'S TREATMENT:                                                                                                                                         DATE:  07/22/23 Manual Therapy: to decrease muscle spasm and pain and improve mobility GH mobilizations inferior and posterior glides  to improve flexion, abduction  PROM to R shoulder to tolerance  Therapeutic Exercise: to improve strength and mobility.  Demo, verbal and tactile cues throughout for technique. Standing shoulder ER YTB back to doorframe x 10  Standing rows YTB x 10 - cues for scap retraction Wall wash into flexion x 10  Pulleys: flexion x 1 min; abduction x 1 min  07/15/2023 EVAL Manual Therapy: to decrease muscle spasm and pain and improve mobility Mobs to [facility] joint, PROM into ER and flexion Therapeutic Exercise: to improve strength and mobility.  Demo, verbal and tactile cues throughout for technique. ER stretch with dowel - demo - performing at home Pulley AROM flexion, scaption and IR - demo   PATIENT EDUCATION: Education details: HEP Person educated: Patient Education method: Explanation, Facilities manager, and Handouts Education comprehension: verbalized understanding  HOME EXERCISE PROGRAM: Access Code: [email or web address] URL: [email or web address] Date: 07/22/2023 Prepared by: Dovie Gell  Exercises - Seated Shoulder Flexion AAROM with Pulley Behind  - 3 x daily - 7 x weekly - 3 sets - 10 reps - Seated  Shoulder Scaption AAROM with Pulley at Side  - 3 x daily - 7 x weekly - 3 sets - 10 reps - Standing Shoulder Internal Rotation AAROM with Pulley  - 3 x daily - 7 x weekly - 3 sets - 10 reps - Standing Shoulder External Rotation with Resistance  - 1 x daily - 3 x weekly - 2 sets - 10 reps - Standing Bilateral Low Shoulder Row with Anchored Resistance  - 1 x daily - 7 x weekly - 2  sets - 10 reps - Shoulder Extension with Resistance  - 1 x daily - 7 x weekly - 2 sets - 10 reps - Shoulder Flexion Wall Slide with Towel  - 1 x daily - 7 x weekly - 2 sets - 10 reps  ASSESSMENT:  CLINICAL IMPRESSION: Pt with increased R shoulder pain coming in today. Session started with [facility] joint mobs along with passive stretching. Very limited mobility d/t pain initially but improved pain and mobility throughout the session. Updated HEP for postural strengthening and added wall slide for stretching of the R shoulder. Her overhead pulleys just arrived last night so she has yet to try the pulleys at home. Tytiana Szczygiel would benefit from skilled therapy to improve R shoulder ROM, decrease pain and improve functional mobility and ability to perform ADLs.  .    OBJECTIVE IMPAIRMENTS: decreased activity tolerance, decreased endurance, decreased mobility, decreased ROM, increased fascial restrictions, impaired perceived functional ability, increased muscle spasms, impaired flexibility, impaired UE functional use, and pain.   ACTIVITY LIMITATIONS: carrying, lifting, bending, sleeping, transfers, bed mobility, dressing, reach over head, and caring for others  PARTICIPATION LIMITATIONS: meal prep, cleaning, driving, community activity, and occupation  PERSONAL FACTORS: Time since onset of injury/illness/exacerbation and 1-2 comorbidities: adhesive capsulitis, MS  are also affecting patient's functional outcome.   REHAB POTENTIAL: Good  CLINICAL DECISION MAKING: Stable/uncomplicated  EVALUATION COMPLEXITY: Low   GOALS: Goals reviewed with patient? Yes  SHORT TERM GOALS: Target date: 07/29/2023   Patient will be independent with initial HEP.  Baseline:  Goal status: IN PROGRESS  LONG TERM GOALS: Target date: 09/09/2023   Patient will be independent with advanced/ongoing HEP to improve outcomes and carryover.  Baseline:  Goal status: IN PROGRESS  2.  Patient will report 75% improvement in  Right shoulder pain to improve QOL.  Baseline: 10/10 with overhead use Goal status: IN PROGRESS  3.  Patient to improve R shoulder AROM to WNL without pain provocation to allow for increased ease of ADLs.  Baseline: see objective Goal status: IN PROGRESS  4.  Patient will report at least 10 points improvement on QuickDash to demonstrate improved functional ability.  Baseline: 52.3% Goal status: IN PROGRESS  5.  Patient will be able to lift 15lbs without increased R shoulder pain to cook.     Baseline: lifting pots increases R shoulder pain Goal status: IN PROGRESS    PLAN:  PT FREQUENCY: 1-2x/week 1x per patient preference  PT DURATION: 8 weeks  PLANNED INTERVENTIONS: 97110-Therapeutic exercises, 97530- Therapeutic activity, [id number]- Neuromuscular re-education, 97535- Self Care, [id number]- Manual therapy, 97035- Ultrasound, 13086- Ionotophoresis 4mg /ml Dexamethasone, Patient/Family education, Taping, Dry Needling, Joint mobilization, Joint manipulation, Cryotherapy, and Moist heat  PLAN FOR NEXT SESSION: continue GH joint mobs, PROM to tolerance, progress HEP for stretching.  Modalities PRN.    Kensly Bowmer L Emilyanne Mcgough, PTA 07/22/2023, 2:34 PM

## 2023-07-29 ENCOUNTER — Ambulatory Visit: Payer: 59

## 2023-08-05 ENCOUNTER — Ambulatory Visit: Payer: 59

## 2023-08-05 DIAGNOSIS — M25511 Pain in right shoulder: Secondary | ICD-10-CM

## 2023-08-05 DIAGNOSIS — M25611 Stiffness of right shoulder, not elsewhere classified: Secondary | ICD-10-CM

## 2023-08-05 NOTE — Therapy (Signed)
OUTPATIENT PHYSICAL THERAPY SHOULDER TREATMENT   Patient Name: Alexa Goodwin MRN: [id number] DOB:06-17-1981, 43 y.o., female Today's Date: 08/05/2023  END OF SESSION:  PT End of Session - 08/05/23 0938     Visit Number 3    Date for PT Re-Evaluation 09/09/23    Authorization Type [employer]    PT Start Time 0931    PT Stop Time 1014    PT Time Calculation (min) 43 min    Activity Tolerance Patient tolerated treatment well    Behavior During Therapy [facility] for tasks assessed/performed               Past Medical History:  Diagnosis Date   Multiple sclerosis (HCC)    Past Surgical History:  Procedure Laterality Date   NO PAST SURGERIES     Patient Active Problem List   Diagnosis Date Noted   Calcific tendinitis of right shoulder 04/23/2023    PCP: Patient, No Pcp Per    REFERRING PROVIDER: Andi Devon, DO  REFERRING DIAG: M75.01 (ICD-10-CM) - Adhesive capsulitis of right shoulder   THERAPY DIAG:  Acute pain of right shoulder  Stiffness of right shoulder, not elsewhere classified  Rationale for Evaluation and Treatment: Rehabilitation  ONSET DATE: ~ June 2024  SUBJECTIVE:                                                                                                                                                                                      SUBJECTIVE STATEMENT: Pt reports that she notices more pain the past few days, it's hard to raise her arm up w/o pain. Hand dominance: Right  PERTINENT HISTORY: From MD notes "She has experienced symptoms for approximately 6 months. Specifically, she describes progressive worsening shoulder pain and range of motion deficits. She has difficulty raising her arm overhead and reaching behind the back. She also experiences night pain which interferes with sleep. Symptoms are affecting activities of daily living.  MRI described intact rotator cuff, diagnosed with adhesive capsulitis and given depo-medrol injection to right GH  joint on 06/11/23 by sports medicine doctor, second opinion by orthopedic surgeon confirmed diagnosis and also recommended PT.    PMH: MS  PAIN:  Are you having pain? Yes: NPRS scale: 5/10 Pain location: R shoulder Pain description: sharp,throbbing shoots down to elbow Aggravating factors: using it, reaching, changing positions at night, sleeping on it Relieving factors: not moving it  PRECAUTIONS: None  RED FLAGS: None   WEIGHT BEARING RESTRICTIONS: No  FALLS:  Has patient fallen in last 6 months? 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 Intact rotator cuff. 2. Very mild degenerative changes of the acromioclavicular joint. 3. Trace fluid within the subacromial/subdeltoid bursa.  PATIENT SURVEYS:  Quick Dash 52.3%  COGNITION: Overall cognitive status: Within functional limits for tasks assessed     SENSATION: WFL  POSTURE: WNL  UPPER EXTREMITY ROM:   Active ROM Right eval Left eval R 08/05/23  Shoulder flexion 100p! 155 120  Shoulder extension 65p! 80   Shoulder abduction 80p! 165 90  Shoulder adduction     Shoulder internal rotation 40p! * 80 *   Shoulder external rotation 25p! * 85 * 40  Elbow flexion     Elbow extension     Wrist flexion     Wrist extension     (Blank rows = not tested) *passive, measured in supine at 60 degs of abduction.   UPPER EXTREMITY MMT:  MMT Right eval Left eval  Shoulder flexion 5 5  Shoulder extension    Shoulder abduction 5 5  Shoulder internal rotation 5 5  Shoulder external rotation 5 5  Elbow flexion 5 5  Elbow extension 5 5  Wrist flexion 5 5  Wrist extension 5 5  Grip strength (lbs) good  good  (Blank rows = not tested)  JOINT MOBILITY TESTING:  Significant tightness in GH capsule  PALPATION:  Tenderness along biceps, tightness UT   TODAY'S TREATMENT:                                                                                                                                         DATE:  08/05/23 Therapeutic Exercise: to improve strength and mobility.  Demo, verbal and tactile cues throughout for technique. UBE L1.0 3 min each way Standing R AAROM abduction with cane x 10  Standing R AAROM flexion with cane 2 x 10  Standing rows RTB x 10 Standing shoulder RTB X 10 Shoulder ER with RTB x 10  Wall slide: flexion x 10; abduction x 10 Manual Therapy: to decrease muscle spasm and pain and improve mobility GH mobilizations inferior and posterior glides  to improve flexion, abduction  PROM to R shoulder to tolerance  07/22/23 Manual Therapy: to decrease muscle spasm and pain and improve mobility GH mobilizations inferior and posterior glides  to improve flexion, abduction  PROM to R shoulder to tolerance  Therapeutic Exercise: to improve strength and mobility.  Demo, verbal and tactile cues throughout for technique. Standing shoulder ER YTB back to doorframe x 10  Standing rows YTB x 10 - cues for scap retraction Wall wash into flexion x 10  Pulleys: flexion x 1 min; abduction x 1 min  07/15/2023 EVAL  Manual Therapy: to decrease muscle spasm and pain and improve mobility Mobs to [facility] joint, PROM into ER and flexion Therapeutic Exercise: to improve strength and mobility.  Demo, verbal and tactile cues throughout for technique. ER stretch with dowel - demo - performing at home Pulley AROM flexion, scaption and IR - demo   PATIENT EDUCATION: Education details: HEP Person educated: Patient Education method: Explanation, Facilities manager, and Handouts Education comprehension: verbalized understanding  HOME EXERCISE PROGRAM: Access Code: [email or web address] URL:  [email or web address] Date: 07/22/2023 Prepared by: Verta Ellen  Exercises - Seated Shoulder Flexion AAROM with Pulley Behind  - 3 x daily - 7 x weekly - 3 sets - 10 reps - Seated Shoulder Scaption AAROM with Pulley at Side  - 3 x daily - 7 x weekly - 3 sets - 10 reps - Standing Shoulder Internal Rotation AAROM with Pulley  - 3 x daily - 7 x weekly - 3 sets - 10 reps - Standing Shoulder External Rotation with Resistance  - 1 x daily - 3 x weekly - 2 sets - 10 reps - Standing Bilateral Low Shoulder Row with Anchored Resistance  - 1 x daily - 7 x weekly - 2 sets - 10 reps - Shoulder Extension with Resistance  - 1 x daily - 7 x weekly - 2 sets - 10 reps - Shoulder Flexion Wall Slide with Towel  - 1 x daily - 7 x weekly - 2 sets - 10 reps  ASSESSMENT:  CLINICAL IMPRESSION: Pt reports with continued limited ROM of her R shoulder with pain going to end ranges. Worked on improving her mobility and reinforcing postural awareness. She is not moving her shoulder at home much d/t pain, reinforced the importance of pain-free ROM exercises to avoid further "freezing" of her shoulder. Improved ROM measured today.  Alexa Goodwin would benefit from skilled therapy to improve R shoulder ROM, decrease pain and improve functional mobility and ability to perform ADLs.   OBJECTIVE IMPAIRMENTS: decreased activity tolerance, decreased endurance, decreased mobility, decreased ROM, increased fascial restrictions, impaired perceived functional ability, increased muscle spasms, impaired flexibility, impaired UE functional use, and pain.   ACTIVITY LIMITATIONS: carrying, lifting, bending, sleeping, transfers, bed mobility, dressing, reach over head, and caring for others  PARTICIPATION LIMITATIONS: meal prep, cleaning, driving, community activity, and occupation  PERSONAL FACTORS: Time since onset of injury/illness/exacerbation and 1-2 comorbidities: adhesive capsulitis, MS  are also affecting patient's  functional outcome.   REHAB POTENTIAL: Good  CLINICAL DECISION MAKING: Stable/uncomplicated  EVALUATION COMPLEXITY: Low   GOALS: Goals reviewed with patient? Yes  SHORT TERM GOALS: Target date: 07/29/2023   Patient will be independent with initial HEP.  Baseline:  Goal status: MET  LONG TERM GOALS: Target date: 09/09/2023   Patient will be independent with advanced/ongoing HEP to improve outcomes and carryover.  Baseline:  Goal status: IN PROGRESS  2.  Patient will report 75% improvement in Right shoulder pain to improve QOL.  Baseline: 10/10 with overhead use Goal status: IN PROGRESS  3.  Patient to improve R shoulder AROM to WNL without pain provocation to allow for increased ease of ADLs.  Baseline: see objective Goal status: IN PROGRESS- 08/05/23  4.  Patient will report at least 10 points improvement on QuickDash to demonstrate improved functional ability.  Baseline: 52.3% Goal status: IN PROGRESS  5.  Patient will be able to lift 15lbs without increased R shoulder pain to cook.     Baseline: lifting pots increases R shoulder pain Goal status: IN PROGRESS  PLAN:  PT FREQUENCY: 1-2x/week 1x per patient preference  PT DURATION: 8 weeks  PLANNED INTERVENTIONS: 97110-Therapeutic exercises, 97530- Therapeutic activity, [id number]- Neuromuscular re-education, 97535- Self Care, [id number]- Manual therapy, 97035- Ultrasound, [id number]- Ionotophoresis 4mg /ml Dexamethasone, Patient/Family education, Taping, Dry Needling, Joint mobilization, Joint manipulation, Cryotherapy, and Moist heat  PLAN FOR NEXT SESSION: continue GH joint mobs, PROM to tolerance, progress HEP for stretching.  Modalities PRN.    Darleene Cleaver, PTA 08/05/2023, 10:15 AM

## 2023-08-12 ENCOUNTER — Ambulatory Visit: Payer: 59

## 2023-08-13 ENCOUNTER — Ambulatory Visit: Payer: 59 | Attending: Family Medicine | Admitting: Physical Therapy

## 2023-08-13 ENCOUNTER — Encounter: Payer: Self-pay | Admitting: Physical Therapy

## 2023-08-13 DIAGNOSIS — M25611 Stiffness of right shoulder, not elsewhere classified: Secondary | ICD-10-CM | POA: Diagnosis present

## 2023-08-13 DIAGNOSIS — M25511 Pain in right shoulder: Secondary | ICD-10-CM | POA: Insufficient documentation

## 2023-08-13 NOTE — Therapy (Addendum)
 OUTPATIENT PHYSICAL THERAPY SHOULDER TREATMENT/Discharge Summary   Patient Name: Alexa Goodwin MRN: [id number] DOB:[birth year], 43 y.o., female Today's Date: 08/13/2023  END OF SESSION:  PT End of Session - 08/13/23 1535     Visit Number 4    Date for PT Re-Evaluation 09/09/23    Authorization Type [employer]    Activity Tolerance Patient tolerated treatment well    Behavior During Therapy [facility] for tasks assessed/performed               Past Medical History:  Diagnosis Date   Multiple sclerosis (HCC)    Past Surgical History:  Procedure Laterality Date   NO PAST SURGERIES     Patient Active Problem List   Diagnosis Date Noted   Calcific tendinitis of right shoulder 04/23/2023    PCP: Patient, No Pcp Per    REFERRING PROVIDER: Teressa Rainell BROCKS, DO  REFERRING DIAG: M75.01 (ICD-10-CM) - Adhesive capsulitis of right shoulder   THERAPY DIAG:  Acute pain of right shoulder  Stiffness of right shoulder, not elsewhere classified  Rationale for Evaluation and Treatment: Rehabilitation  ONSET DATE: ~ June 2024  SUBJECTIVE:                                                                                                                                                                                      SUBJECTIVE STATEMENT: Mobility wise feels like it is getting better but the pain is still there.   Hand dominance: Right  PERTINENT HISTORY: From MD notes She has experienced symptoms for approximately 6 months. Specifically, she describes progressive worsening shoulder pain and range of motion deficits. She has difficulty raising her arm overhead and reaching behind the back. She also experiences night pain which interferes with sleep. Symptoms are affecting activities of daily living.  MRI described intact rotator cuff, diagnosed with adhesive capsulitis and given depo-medrol  injection to right GH joint on 06/11/23 by sports medicine doctor, second opinion by orthopedic surgeon  confirmed diagnosis and also recommended PT.    PMH: MS  PAIN:  Are you having pain? Yes: NPRS scale: 5/10 Pain location: R shoulder Pain description: sharp,throbbing shoots down to elbow Aggravating factors: using it, reaching, changing positions at night, sleeping on it Relieving factors: not moving it  PRECAUTIONS: None  RED FLAGS: None   WEIGHT BEARING RESTRICTIONS: No  FALLS:  Has patient fallen in last 6 months? 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 Intact rotator cuff. 2. Very mild degenerative changes of the acromioclavicular joint. 3. Trace fluid within the subacromial/subdeltoid bursa.  PATIENT SURVEYS:  Quick Dash 52.3%  COGNITION: Overall cognitive status: Within functional limits for tasks assessed     SENSATION: WFL  POSTURE: WNL  UPPER EXTREMITY ROM:   Active ROM Right eval Left eval R 08/05/23 Right 08/13/23  Shoulder flexion 100p! 155 120 130  Shoulder extension 65p! 80    Shoulder abduction 80p! 165 90 80  Shoulder adduction      Shoulder internal rotation 40p! * 80 *  60  Shoulder external rotation 25p! * 85 * 40 40  Elbow flexion      Elbow extension      Wrist flexion      Wrist extension      (Blank rows = not tested) *passive, measured in supine at 60 degs of abduction.   UPPER EXTREMITY MMT:  MMT Right eval Left eval  Shoulder flexion 5 5  Shoulder extension    Shoulder abduction 5 5  Shoulder internal rotation 5 5  Shoulder external rotation 5 5  Elbow flexion 5 5  Elbow extension 5 5  Wrist flexion 5 5  Wrist extension 5 5  Grip strength (lbs) good good  (Blank rows = not tested)  JOINT  MOBILITY TESTING:  Significant tightness in GH capsule  PALPATION:  Tenderness along biceps, tightness UT   TODAY'S TREATMENT:                                                                                                                                         DATE:   08/13/23 Therapeutic Exercise: to improve strength and mobility.  Demo, verbal and tactile cues throughout for technique. Pulleys x 3 min UBE forward x 3 min  Posterior capsule stretches - standing, against wall, sitting, see below Sleeper stretch Supine shoulder IR/ER stretch for capsule with 3# weight Manual Therapy: to decrease muscle spasm and pain and improve mobility GH mobilizations inferior and posterior glides  to improve flexion, abduction  PROM to R shoulder to tolerance  08/05/23 Therapeutic Exercise: to improve strength and mobility.  Demo, verbal and tactile cues throughout for technique. UBE L1.0 3 min each way Standing R AAROM abduction with cane x 10  Standing R AAROM flexion with cane 2 x 10  Standing rows RTB x 10 Standing shoulder RTB X 10 Shoulder ER with RTB x 10  Wall slide: flexion x 10; abduction x 10 Manual Therapy: to decrease muscle spasm and pain and improve mobility GH mobilizations inferior and posterior glides  to improve flexion, abduction  PROM to R shoulder to tolerance  07/22/23 Manual Therapy: to decrease muscle spasm and pain and improve mobility GH mobilizations inferior and posterior glides  to improve  flexion, abduction  PROM to R shoulder to tolerance  Therapeutic Exercise: to improve strength and mobility.  Demo, verbal and tactile cues throughout for technique. Standing shoulder ER YTB back to doorframe x 10  Standing rows YTB x 10 - cues for scap retraction Wall wash into flexion x 10  Pulleys: flexion x 1 min; abduction x 1 min  PATIENT EDUCATION: Education details: HEP update Person educated: Patient Education method: Explanation, Demonstration, and  Handouts Education comprehension: verbalized understanding  HOME EXERCISE PROGRAM: Access Code: [email or web address] URL: [email or web address] Date: 07/22/2023 Prepared by: Sol Gaskins  Exercises - Seated Shoulder Flexion AAROM with Pulley Behind  - 3 x daily - 7 x weekly - 3 sets - 10 reps - Seated Shoulder Scaption AAROM with Pulley at Side  - 3 x daily - 7 x weekly - 3 sets - 10 reps - Standing Shoulder Internal Rotation AAROM with Pulley  - 3 x daily - 7 x weekly - 3 sets - 10 reps - Standing Shoulder External Rotation with Resistance  - 1 x daily - 3 x weekly - 2 sets - 10 reps - Standing Bilateral Low Shoulder Row with Anchored Resistance  - 1 x daily - 7 x weekly - 2 sets - 10 reps - Shoulder Extension with Resistance  - 1 x daily - 7 x weekly - 2 sets - 10 reps - Shoulder Flexion Wall Slide with Towel  - 1 x daily - 7 x weekly - 2 sets - 10 reps    ASSESSMENT:  CLINICAL IMPRESSION: Alexa Goodwin report continued limited ROM of her R shoulder with pain going to end ranges. Today updated HEP adding more capsule stretches, tolerated well.  Still noted that restrictions in ROM is from capsular tightness, but ROM is slowly improving.  Discussed MUA today.  Alexa Goodwin continues to demonstrate potential for improvement and would benefit from continued skilled therapy to address impairments.     OBJECTIVE IMPAIRMENTS: decreased activity tolerance, decreased endurance, decreased mobility, decreased ROM, increased fascial restrictions, impaired perceived functional ability, increased muscle spasms, impaired flexibility, impaired UE functional use, and pain.   ACTIVITY LIMITATIONS: carrying, lifting, bending, sleeping, transfers, bed mobility, dressing, reach over head, and caring for others  PARTICIPATION LIMITATIONS: meal prep, cleaning, driving, community activity, and occupation  PERSONAL FACTORS: Time since onset of injury/illness/exacerbation and 1-2 comorbidities: adhesive  capsulitis, MS  are also affecting patient's functional outcome.   REHAB POTENTIAL: Good  CLINICAL DECISION MAKING: Stable/uncomplicated  EVALUATION COMPLEXITY: Low   GOALS: Goals reviewed with patient? Yes  SHORT TERM GOALS: Target date: 07/29/2023   Patient will be independent with initial HEP.  Baseline:  Goal status: MET  LONG TERM GOALS: Target date: 09/09/2023   Patient will be independent with advanced/ongoing HEP to improve outcomes and carryover.  Baseline:  Goal status: IN PROGRESS  2.  Patient will report 75% improvement in Right shoulder pain to improve QOL.  Baseline: 10/10 with overhead use Goal status: IN PROGRESS  3.  Patient to improve R shoulder AROM to WNL without pain provocation to allow for increased ease of ADLs.  Baseline: see objective Goal status: IN PROGRESS- 08/05/23  4.  Patient will report at least 10 points improvement on QuickDash to demonstrate improved functional ability.  Baseline: 52.3% Goal status: IN PROGRESS  5.  Patient will be able to lift 15lbs without increased R shoulder pain to cook.     Baseline: lifting pots increases R shoulder pain Goal status: IN PROGRESS  PLAN:  PT FREQUENCY: 1-2x/week 1x per patient preference  PT DURATION: 8 weeks  PLANNED INTERVENTIONS: 97110-Therapeutic exercises, 97530- Therapeutic activity, [id number]- Neuromuscular re-education, 97535- Self Care, [id number]- Manual therapy, 97035- Ultrasound, 02966- Ionotophoresis 4mg /ml Dexamethasone, Patient/Family education, Taping, Dry Needling, Joint mobilization, Joint manipulation, Cryotherapy, and Moist heat  PLAN FOR NEXT SESSION: continue GH joint mobs, PROM to tolerance, progress HEP for stretching.  Modalities PRN.    Alexa Goodwin, PT 08/13/2023, 3:36 PM    PHYSICAL THERAPY DISCHARGE SUMMARY  Visits from Start of Care: 4  Current functional level related to goals / functional outcomes: Improved ROM   Remaining deficits: Continued pain,  weakness   Education / Equipment: HEP  Plan: Patient is being discharged due to not returning to therapy after 08/13/2023.  She would require new order to return since it has been well over 30 days.    Alexa Goodwin, PT  09/30/2023 10:39 AM

## 2023-08-19 ENCOUNTER — Ambulatory Visit: Payer: 59 | Admitting: Physical Therapy

## 2023-08-26 ENCOUNTER — Encounter: Payer: 59 | Admitting: Physical Therapy
